# Patient Record
Sex: Male | Born: 1990 | Race: Black or African American | Hispanic: No | Marital: Single | Smoking: Never smoker
Health system: Southern US, Community
[De-identification: ages and names within clinical notes are randomized; demographics above are authoritative.]

## PROBLEM LIST (undated history)

## (undated) DIAGNOSIS — J45909 Unspecified asthma, uncomplicated: Secondary | ICD-10-CM

## (undated) DIAGNOSIS — I10 Essential (primary) hypertension: Secondary | ICD-10-CM

## (undated) DIAGNOSIS — F319 Bipolar disorder, unspecified: Secondary | ICD-10-CM

## (undated) DIAGNOSIS — F329 Major depressive disorder, single episode, unspecified: Secondary | ICD-10-CM

## (undated) DIAGNOSIS — E119 Type 2 diabetes mellitus without complications: Secondary | ICD-10-CM

## (undated) DIAGNOSIS — R42 Dizziness and giddiness: Secondary | ICD-10-CM

## (undated) DIAGNOSIS — F419 Anxiety disorder, unspecified: Secondary | ICD-10-CM

## (undated) DIAGNOSIS — F32A Depression, unspecified: Secondary | ICD-10-CM

## (undated) HISTORY — DX: Unspecified asthma, uncomplicated: J45.909

## (undated) HISTORY — DX: Essential (primary) hypertension: I10

## (undated) HISTORY — DX: Type 2 diabetes mellitus without complications: E11.9

## (undated) HISTORY — PX: WISDOM TOOTH EXTRACTION: SHX21

---
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
## 2011-03-24 LAB — URINALYSIS, ROUTINE W REFLEX MICROSCOPIC
Glucose, UA: NEGATIVE mg/dL
Protein, ur: NEGATIVE mg/dL

## 2011-03-24 LAB — POCT I-STAT, CHEM 8
BUN: 18 mg/dL (ref 6–23)
HCT: 45 % (ref 36.0–49.0)
Hemoglobin: 15.3 g/dL (ref 12.0–16.0)
Sodium: 139 mEq/L (ref 135–145)
TCO2: 27 mmol/L (ref 0–100)

## 2011-03-24 LAB — RAPID URINE DRUG SCREEN, HOSP PERFORMED: Benzodiazepines: NOT DETECTED

## 2011-05-11 ENCOUNTER — Emergency Department (HOSPITAL_COMMUNITY)
Admission: EM | Admit: 2011-05-11 | Discharge: 2011-05-12 | Disposition: A | Discharge status: Home / Self Care | Payer: Self-pay | Attending: Emergency Medicine | Admitting: Emergency Medicine

## 2011-05-11 DIAGNOSIS — R000 Tachycardia, unspecified: Secondary | ICD-10-CM | POA: Insufficient documentation

## 2011-05-11 DIAGNOSIS — R42 Dizziness and giddiness: Secondary | ICD-10-CM | POA: Insufficient documentation

## 2011-05-11 DIAGNOSIS — R079 Chest pain, unspecified: Secondary | ICD-10-CM | POA: Insufficient documentation

## 2011-05-11 DIAGNOSIS — R51 Headache: Secondary | ICD-10-CM | POA: Insufficient documentation

## 2011-05-11 DIAGNOSIS — F410 Panic disorder [episodic paroxysmal anxiety] without agoraphobia: Secondary | ICD-10-CM | POA: Insufficient documentation

## 2011-12-06 ENCOUNTER — Encounter: Payer: Self-pay | Admitting: *Deleted

## 2011-12-06 ENCOUNTER — Other Ambulatory Visit: Payer: Self-pay

## 2011-12-06 ENCOUNTER — Emergency Department (HOSPITAL_COMMUNITY)
Admission: EM | Admit: 2011-12-06 | Discharge: 2011-12-06 | Disposition: A | Discharge status: Home / Self Care | Payer: Self-pay | Attending: Emergency Medicine | Admitting: Emergency Medicine

## 2011-12-06 DIAGNOSIS — R42 Dizziness and giddiness: Secondary | ICD-10-CM | POA: Insufficient documentation

## 2011-12-06 DIAGNOSIS — R110 Nausea: Secondary | ICD-10-CM | POA: Insufficient documentation

## 2011-12-06 MED ORDER — DIAZEPAM 5 MG PO TABS
5.0000 mg | ORAL_TABLET | Freq: Once | ORAL | Status: AC
  Administered 2011-12-06: 5 mg via ORAL
  Filled 2011-12-06: qty 1

## 2011-12-06 MED ORDER — MECLIZINE HCL 25 MG PO TABS
25.0000 mg | ORAL_TABLET | Freq: Once | ORAL | Status: AC
  Administered 2011-12-06: 25 mg via ORAL
  Filled 2011-12-06: qty 1

## 2011-12-06 MED ORDER — MECLIZINE HCL 50 MG PO TABS: 25.0000 mg | ORAL_TABLET | Freq: Four times a day (QID) | ORAL | Status: AC | PRN

## 2011-12-06 MED ORDER — PROMETHAZINE HCL 25 MG PO TABS
25.0000 mg | ORAL_TABLET | Freq: Once | ORAL | Status: AC
  Administered 2011-12-06: 25 mg via ORAL
  Filled 2011-12-06: qty 1

## 2011-12-06 NOTE — ED Notes (Signed)
Pt laughing but refusing to stand up to get on the scale, when mother not @ side, pt stood up without difficulty & got on scale.
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
## 2011-12-06 NOTE — ED Notes (Signed)
Pt states "this has been going on a long time, was dx'd with anxiety @ [facility], don't take any medicine, I was dizzy, nauseated, my chest hurt"; mother now stating "he may need to be checked out, I'm bi-polar"; pt NAD
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
## 2015-04-05 ENCOUNTER — Encounter (HOSPITAL_COMMUNITY): Payer: Self-pay | Admitting: Emergency Medicine

## 2015-04-05 ENCOUNTER — Emergency Department (HOSPITAL_COMMUNITY)
Admission: EM | Admit: 2015-04-05 | Discharge: 2015-04-06 | Disposition: A | Discharge status: Other Acute Inpatient Hospital | Payer: Self-pay | Attending: Emergency Medicine | Admitting: Emergency Medicine

## 2015-04-05 DIAGNOSIS — F410 Panic disorder [episodic paroxysmal anxiety] without agoraphobia: Secondary | ICD-10-CM

## 2015-04-05 DIAGNOSIS — F411 Generalized anxiety disorder: Secondary | ICD-10-CM

## 2015-04-05 DIAGNOSIS — F329 Major depressive disorder, single episode, unspecified: Secondary | ICD-10-CM | POA: Insufficient documentation

## 2015-04-05 HISTORY — DX: Major depressive disorder, single episode, unspecified: F32.9

## 2015-04-05 HISTORY — DX: Anxiety disorder, unspecified: F41.9

## 2015-04-05 HISTORY — DX: Bipolar disorder, unspecified: F31.9

## 2015-04-05 HISTORY — DX: Depression, unspecified: F32.A

## 2015-04-05 HISTORY — DX: Dizziness and giddiness: R42

## 2015-04-05 LAB — CBC WITH DIFFERENTIAL/PLATELET
Basophils Absolute: 0 10*3/uL (ref 0.0–0.1)
Basophils Relative: 0 % (ref 0–1)
Eosinophils Absolute: 0.1 10*3/uL (ref 0.0–0.7)
Eosinophils Relative: 1 % (ref 0–5)
HCT: 43.2 % (ref 39.0–52.0)
Hemoglobin: 14.1 g/dL (ref 13.0–17.0)
Lymphocytes Relative: 23 % (ref 12–46)
Lymphs Abs: 1.5 10*3/uL (ref 0.7–4.0)
MCH: 28.9 pg (ref 26.0–34.0)
MCHC: 32.6 g/dL (ref 30.0–36.0)
MCV: 88.5 fL (ref 78.0–100.0)
Monocytes Absolute: 0.3 10*3/uL (ref 0.1–1.0)
Monocytes Relative: 5 % (ref 3–12)
Neutro Abs: 4.5 10*3/uL (ref 1.7–7.7)
Neutrophils Relative %: 71 % (ref 43–77)
Platelets: 305 10*3/uL (ref 150–400)
RBC: 4.88 MIL/uL (ref 4.22–5.81)
RDW: 13.1 % (ref 11.5–15.5)
WBC: 6.4 10*3/uL (ref 4.0–10.5)

## 2015-04-05 LAB — BASIC METABOLIC PANEL
Anion gap: 7 (ref 5–15)
BUN: 10 mg/dL (ref 6–23)
CO2: 25 mmol/L (ref 19–32)
Calcium: 9.1 mg/dL (ref 8.4–10.5)
Chloride: 105 mmol/L (ref 96–112)
Creatinine, Ser: 0.94 mg/dL (ref 0.50–1.35)
GFR calc Af Amer: >90 mL/min (ref >90)
GFR calc non Af Amer: >90 mL/min (ref >90)
Glucose, Bld: 101 mg/dL — ABNORMAL HIGH (ref 70–99)
Potassium: 3.8 mmol/L (ref 3.5–5.1)
Sodium: 137 mmol/L (ref 135–145)

## 2015-04-05 LAB — ETHANOL: Alcohol, Ethyl (B): <5 mg/dL (ref 0–9)

## 2015-04-05 MED ORDER — ACETAMINOPHEN 325 MG PO TABS
650.0000 mg | ORAL_TABLET | ORAL | Status: DC | PRN
  Filled 2015-04-05: qty 2

## 2015-04-05 MED ORDER — ONDANSETRON HCL 4 MG PO TABS: 4.0000 mg | ORAL_TABLET | Freq: Three times a day (TID) | ORAL | Status: DC | PRN

## 2015-04-05 MED ORDER — ALUM & MAG HYDROXIDE-SIMETH 200-200-20 MG/5ML PO SUSP: 30.0000 mL | ORAL | Status: DC | PRN

## 2015-04-05 MED ORDER — IBUPROFEN 200 MG PO TABS: 600.0000 mg | ORAL_TABLET | Freq: Three times a day (TID) | ORAL | Status: DC | PRN

## 2015-04-05 MED ORDER — LORAZEPAM 1 MG PO TABS: 1.0000 mg | ORAL_TABLET | Freq: Three times a day (TID) | ORAL | Status: DC | PRN

## 2015-04-05 NOTE — ED Notes (Signed)
Pt. To [facility] from ED ambulatory without difficulty, to room  . Report from [place]. Pt. Is alert and oriented, warm and dry in no distress. Pt. Denies SI, HI, and AVH. Pt. Calm and cooperative. Pt. Made aware of security cameras and Q15 minute rounds. Pt. Encouraged to let Nursing staff know of any concerns or needs.

## 2015-04-05 NOTE — ED Notes (Addendum)
Pt reports he has not been able to afford psych medications c/o anxiety attacks and SI. Pt denies HI. Pt reports he does not have a plan for SI and states feeling comes a goes. Pt also c/o dizziness and headache due to anxiety attack.

## 2015-04-05 NOTE — ED Notes (Signed)
Pt. Noted sleeping in room. No complaints or concerns voiced. No distress or abnormal behavior noted. Will continue to monitor with security cameras. Q 15 minute rounds continue. 
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
## 2015-04-06 ENCOUNTER — Encounter (HOSPITAL_COMMUNITY): Payer: Self-pay | Admitting: *Deleted

## 2015-04-06 ENCOUNTER — Observation Stay (HOSPITAL_COMMUNITY)
Admission: AD | Admit: 2015-04-06 | Discharge: 2015-04-07 | Disposition: A | Discharge status: Home / Self Care | Payer: Federal, State, Local not specified - Other | Source: Intra-hospital | Attending: Psychiatry | Admitting: Psychiatry

## 2015-04-06 DIAGNOSIS — R51 Headache: Secondary | ICD-10-CM | POA: Insufficient documentation

## 2015-04-06 DIAGNOSIS — F411 Generalized anxiety disorder: Principal | ICD-10-CM | POA: Diagnosis present

## 2015-04-06 DIAGNOSIS — F410 Panic disorder [episodic paroxysmal anxiety] without agoraphobia: Secondary | ICD-10-CM | POA: Insufficient documentation

## 2015-04-06 DIAGNOSIS — R45851 Suicidal ideations: Secondary | ICD-10-CM | POA: Insufficient documentation

## 2015-04-06 DIAGNOSIS — R42 Dizziness and giddiness: Secondary | ICD-10-CM | POA: Insufficient documentation

## 2015-04-06 DIAGNOSIS — F319 Bipolar disorder, unspecified: Secondary | ICD-10-CM | POA: Insufficient documentation

## 2015-04-06 DIAGNOSIS — R110 Nausea: Secondary | ICD-10-CM | POA: Insufficient documentation

## 2015-04-06 MED ORDER — LORAZEPAM 1 MG PO TABS
1.0000 mg | ORAL_TABLET | Freq: Four times a day (QID) | ORAL | Status: DC | PRN
  Filled 2015-04-06: qty 1

## 2015-04-06 MED ORDER — MECLIZINE HCL 25 MG PO TABS
25.0000 mg | ORAL_TABLET | Freq: Three times a day (TID) | ORAL | Status: DC | PRN
  Administered 2015-04-06: 25 mg via ORAL
  Filled 2015-04-06: qty 1

## 2015-04-06 MED ORDER — ZOLPIDEM TARTRATE 5 MG PO TABS: 5.0000 mg | ORAL_TABLET | Freq: Every evening | ORAL | Status: DC | PRN

## 2015-04-06 MED ORDER — IBUPROFEN 600 MG PO TABS
600.0000 mg | ORAL_TABLET | Freq: Four times a day (QID) | ORAL | Status: DC | PRN
  Administered 2015-04-06 – 2015-04-07 (×2): 600 mg via ORAL
  Filled 2015-04-06 (×2): qty 1

## 2015-04-06 MED ORDER — ONDANSETRON 4 MG PO TBDP: 4.0000 mg | ORAL_TABLET | Freq: Once | ORAL | Status: AC | PRN

## 2015-04-06 MED ORDER — MECLIZINE HCL 25 MG PO TABS
25.0000 mg | ORAL_TABLET | Freq: Three times a day (TID) | ORAL | Status: DC | PRN
  Filled 2015-04-06: qty 1

## 2015-04-06 NOTE — ED Notes (Signed)
Up to the bathroom 

## 2015-04-06 NOTE — ED Notes (Addendum)
Chest pain has resolved, Pt reports that he does not have a HA, but has intermittant pain in his head, worse w/ light and eye movement.  Pt also reports that the dizzyness is worse w/ head/eye movement.  NP aware. Pt declined tylenol because it is is not a HA.

## 2015-04-06 NOTE — ED Provider Notes (Signed)
CSN: [id number]     Arrival date & time 04/05/15  1947 History   First MD Initiated Contact with Patient 04/05/15 2018     Chief Complaint  Patient presents with  . Anxiety  . Suicidal     (Consider location/radiation/quality/duration/timing/severity/associated sxs/prior Treatment) HPI Patient presents to the emergency department with suicidal ideation and frequent anxiety attacks.  Patient states he is unable to get his medications due to cost.  The patient states that nothing seems make his condition, better or worse.  The patient states that he is not having any hallucinations, nausea, vomiting, weakness, dizziness, back pain, neck pain, shortness of breath, cough, runny nose, sore throat, fever or syncope.  Patient states that he has had some headache and chest discomfort since all this started he states the symptoms have been getting worse over the last week Past Medical History  Diagnosis Date  . Anxiety   . Bipolar 1 disorder   . Depression   . Vertigo    History reviewed. No pertinent past surgical history. History reviewed. No pertinent family history. History  Substance Use Topics  . Smoking status: Never Smoker   . Smokeless tobacco: Not on file  . Alcohol Use: No    Review of Systems  All other systems negative except as documented in the HPI. All pertinent positives and negatives as reviewed in the HPI.  Allergies  Review of patient's allergies indicates no known allergies.  Home Medications   Prior to Admission medications   Medication Sig Start Date End Date Taking? Authorizing Provider  Acetaminophen (PAIN RELIEF PO) Take 1 tablet by mouth daily as needed (pain).   Yes Historical Provider, MD   BP 141/92 mmHg  Pulse 78  Temp(Src) 98.5 F (36.9 C) (Oral)  Resp 22  SpO2 100% Physical Exam  Constitutional: He is oriented to person, place, and time. He appears well-developed and well-nourished. No distress.  HENT:  Head: Normocephalic and atraumatic.   Mouth/Throat: Oropharynx is clear and moist.  Eyes: Pupils are equal, round, and reactive to light.  Neck: Normal range of motion. Neck supple.  Cardiovascular: Normal rate, regular rhythm and normal heart sounds.  Exam reveals no gallop and no friction rub.   No murmur heard. Pulmonary/Chest: Effort normal and breath sounds normal. No respiratory distress.  Neurological: He is alert and oriented to person, place, and time. He exhibits normal muscle tone. Coordination normal.  Skin: Skin is warm and dry.  Psychiatric: Judgment normal. His mood appears anxious. He is not agitated. Thought content is not paranoid and not delusional. Cognition and memory are normal. He exhibits a depressed mood. He expresses suicidal ideation. He expresses no homicidal ideation. He expresses no suicidal plans and no homicidal plans.    ED Course  Procedures (including critical care time) Labs Review Labs Reviewed  BASIC METABOLIC PANEL - Abnormal; Notable for the following:    Glucose, Bld 101 (*)    All other components within normal limits  CBC WITH DIFFERENTIAL/PLATELET  ETHANOL  URINE RAPID DRUG SCREEN (HOSP PERFORMED)   The patient will need TTS assessment     Dalia Heading, PA-C 04/06/15 0120  Lacretia Leigh, MD 04/06/15 1537

## 2015-04-06 NOTE — Progress Notes (Signed)
D: Patient seen watching TV and interacting with peer. Patient stated "I am fine now except for mild dizziness". Patient denies pain, SI, AH/VH at this time. Patient is pleasant and appropriate.  A: Encouragement offered to the patient. Due medications given as ordered. Every 15 minutes check maintained for safety. Will continue to monitor for stability.  R: patient very receptive to nursing interventions.

## 2015-04-06 NOTE — ED Notes (Signed)
Sandwich and ice pop given

## 2015-04-06 NOTE — ED Notes (Signed)
Pt. Noted sleeping in room. No complaints or concerns voiced. No distress or abnormal behavior noted. Will continue to monitor with security cameras. Q 15 minute rounds continue. 
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
## 2015-04-06 NOTE — ED Notes (Addendum)
Pt ambulatory w/o difficulty to [facility] w/ [facility]

## 2015-04-06 NOTE — ED Notes (Signed)
[facility] contacted and will be here in approx 20 mins

## 2015-04-06 NOTE — ED Notes (Signed)
Up on the phone 

## 2015-04-06 NOTE — ED Notes (Signed)
EDP updated-recommend tylenol/motrin

## 2015-04-06 NOTE — ED Notes (Signed)
Pt reports antervirt did not help dizzyness and does not want to take tylenol

## 2015-04-06 NOTE — ED Notes (Signed)
Pt now reports that the head pain has resolved, and dizzyness has improved.  Pt has been ambulatory in the unit during the day w/o difficulty.
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
## 2015-04-06 NOTE — Progress Notes (Signed)
[facility] Assessment Progress Note Counselor spoke to pt about discharge planning and his reasons for presenting to the OBS unit. Pt currently denies SI, HI, or AVH. He indicated that he is just dizzy. Pt shares that he has had panic attacks all the time for about 5 years, but he's been maintaining because he "won't feed into them" or with "take an aspirin and go to sleep". Pt shares that the panic attack that brought him to the ED was the "most intense one" he has had and he wasn't able to ignore it.   Pt was not able to identify one single trigger with regard to his continuous panic attacks. He speculated that his diet and lack of exercise might be a cause and indicated that he planned on changing his diet and increasing his exercise upon discharge. Pt also voluntarily identified singing and running as outlets for him to relieve anxiety. Pt denies any hx of or current substance abuse issues.   Pt shared that he lived "on and off" with his sister. He indicated that all of his things are there, but sometimes he's not able to stay b/c she has Section 8 and is not supposed to have other people not on the lease living with her. He shared that when he is not at his sister's house, he will sometimes stay in a shelter. He indicated that he has a couple of jobs lined up and just needs to fill out the applications online. He says that shelter is not an immediate issue for him.   Pt shared that he really just needs help with getting his medicines and obtaining counseling. Counselor discussed [facility] with pt and he indicated that he had been there before and would like to go there again for counseling and psychiatric care.   Marisa Cyphers, MS Counselor

## 2015-04-06 NOTE — ED Notes (Signed)
Up to the desk, pt is aware that he is goingg to be transferred to [facility] obs unit today.
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
## 2015-04-07 NOTE — BHH Counselor (Addendum)
Writer called and Patent attorney for Melissa at [facility]. Melissa called back and said that they don't have any beds. Writer called [facility] and [facility] reports that pt doesn't meet SA treatment criteria.  Arnold Long, [place] Therapeutic Triage Specialist

## 2015-04-07 NOTE — Discharge Summary (Signed)
Physician Discharge Summary Note  Patient:  Nathan Bradford is an 24 y.o., male with SI and panic related to dizzy spells hx vertigo.  MRN:  [id number] DOB:  [birth year] Patient phone:  [phone] (home)  Patient address:   [address] [place] [address],  Total Time spent with patient: 30 minutes  Date of Admission:  04/06/2015 Date of Discharge: 04/07/2015  HPI: copied forward Patient presents to the emergency department with suicidal ideation and frequent anxiety attacks. Patient states he is unable to get his medications due to cost. The patient states that nothing seems make his condition, better or worse. The patient states that he is not having any hallucinations, nausea, vomiting, weakness, dizziness, back pain, neck pain, shortness of breath, cough, runny nose, sore throat, fever or syncope. Patient states that he has had some headache and chest discomfort since all this started he states the symptoms have been getting worse over the last week  04/07/2015: patient explains to writer that has a history of bipolar disorder, anxiety, and vertigo. Explains that because his vertigo can sometimes be severe this causes increasing anxiety and panic. When asked if he has SI he states, "I always have suicidal thoughts but I will not hurt myself". Denies HI or AVH. Plans to report to [facility] for medication management upon discharge from [facility] as they do not close until 8pm. Agrees to sign safety contract.    Principal Problem: <principal problem not specified> Discharge Diagnoses: Patient Active Problem List   Diagnosis Date Noted  . Generalized anxiety disorder [F41.1] 04/06/2015  . Panic disorder [F41.0] 04/06/2015    Musculoskeletal: Strength & Muscle Tone: within normal limits Gait & Station: normal Patient leans: N/A  Psychiatric Specialty Exam: Physical Exam  ROS  Blood pressure 120/76, pulse 64, temperature 98.3 F (36.8 C), temperature  source Oral, resp. rate 20, height '6\' 1"'  (1.854 m), weight 116.574 kg (257 lb), SpO2 100 %.Body mass index is 33.91 kg/(m^2).  General Appearance: Fairly Groomed  Engineer, water::  Good  Speech:  Clear and Coherent and Normal Rate  Volume:  Normal  Mood:  Anxious  Affect:  Appropriate  Thought Process:  Coherent and Goal Directed  Orientation:  Full (Time, Place, and Person)  Thought Content:  WDL  Suicidal Thoughts:  Yes.  without intent/plan  Homicidal Thoughts:  No  Memory:  Immediate;   Good Recent;   Good  Judgement:  Good  Insight:  Good  Psychomotor Activity:  Normal  Concentration:  Good  Recall:  Good  Fund of Knowledge:Good  Language: Good  Akathisia:  No  Handed:    AIMS (if indicated):     Assets:  Communication Skills Desire for Improvement  ADL's:  Intact  Cognition: WNL  Sleep:      Past Medical History:  Past Medical History  Diagnosis Date  . Anxiety   . Bipolar 1 disorder   . Depression   . Vertigo    History reviewed. No pertinent past surgical history. Family History: History reviewed. No pertinent family history. Social History:  History  Alcohol Use No     History  Drug Use No    History   Social History  . Marital Status: Single    Spouse Name: N/A  . Number of Children: N/A  . Years of Education: N/A   Social History Main Topics  . Smoking status: Never Smoker   . Smokeless tobacco: Not on file  . Alcohol Use: No  .  Drug Use: No  . Sexual Activity: Not on file   Other Topics Concern  . None   Social History Narrative    Past Psychiatric History: Hospitalizations:  Outpatient Care:  Substance Abuse Care:  Self-Mutilation:  Suicidal Attempts:  Violent Behaviors:   Risk to Self: Is patient at risk for suicide?: No- SI without a plan; contracts for safety  Risk to Others:  none Prior Inpatient Therapy:   Prior Outpatient Therapy:    Level of Care:  OP  Hospital Course:  04/05/2015-04/07/2015  Consults:   None  Significant Diagnostic Studies:  labs: reviewed- stable   Discharge Vitals:   Blood pressure 120/76, pulse 64, temperature 98.3 F (36.8 C), temperature source Oral, resp. rate 20, height '6\' 1"'  (1.854 m), weight 116.574 kg (257 lb), SpO2 100 %. Body mass index is 33.91 kg/(m^2). Lab Results:   Results for orders placed or performed during the hospital encounter of 04/05/15 (from the past 72 hour(s))  CBC with Differential     Status: None   Collection Time: 04/05/15 10:50 PM  Result Value Ref Range   WBC 6.4 4.0 - 10.5 K/uL   RBC 4.88 4.22 - 5.81 MIL/uL   Hemoglobin 14.1 13.0 - 17.0 g/dL   HCT 43.2 39.0 - 52.0 %   MCV 88.5 78.0 - 100.0 fL   MCH 28.9 26.0 - 34.0 pg   MCHC 32.6 30.0 - 36.0 g/dL   RDW 13.1 11.5 - 15.5 %   Platelets 305 150 - 400 K/uL   Neutrophils Relative % 71 43 - 77 %   Neutro Abs 4.5 1.7 - 7.7 K/uL   Lymphocytes Relative 23 12 - 46 %   Lymphs Abs 1.5 0.7 - 4.0 K/uL   Monocytes Relative 5 3 - 12 %   Monocytes Absolute 0.3 0.1 - 1.0 K/uL   Eosinophils Relative 1 0 - 5 %   Eosinophils Absolute 0.1 0.0 - 0.7 K/uL   Basophils Relative 0 0 - 1 %   Basophils Absolute 0.0 0.0 - 0.1 K/uL  Basic metabolic panel     Status: Abnormal   Collection Time: 04/05/15 10:50 PM  Result Value Ref Range   Sodium 137 135 - 145 mmol/L   Potassium 3.8 3.5 - 5.1 mmol/L   Chloride 105 96 - 112 mmol/L   CO2 25 19 - 32 mmol/L   Glucose, Bld 101 (H) 70 - 99 mg/dL   BUN 10 6 - 23 mg/dL   Creatinine, Ser 0.94 0.50 - 1.35 mg/dL   Calcium 9.1 8.4 - 10.5 mg/dL   GFR calc non Af Amer >90 >90 mL/min   GFR calc Af Amer >90 >90 mL/min    Comment: (NOTE) The eGFR has been calculated using the CKD EPI equation. This calculation has not been validated in all clinical situations. eGFR's persistently <90 mL/min signify possible Chronic Kidney Disease.    Anion gap 7 5 - 15  Ethanol     Status: None   Collection Time: 04/05/15 10:51 PM  Result Value Ref Range   Alcohol, Ethyl (B) <5  0 - 9 mg/dL    Comment:        LOWEST DETECTABLE LIMIT FOR SERUM ALCOHOL IS 11 mg/dL FOR MEDICAL PURPOSES ONLY     Physical Findings: AIMS: Facial and Oral Movements Muscles of Facial Expression: None, normal Lips and Perioral Area: None, normal Jaw: None, normal Tongue: None, normal,Extremity Movements Upper (arms, wrists, hands, fingers): None, normal Lower (legs, knees, ankles, toes): None,  normal, Trunk Movements Neck, shoulders, hips: None, normal, Overall Severity Severity of abnormal movements (highest score from questions above): None, normal Incapacitation due to abnormal movements: None, normal Patient's awareness of abnormal movements (rate only patient's report): No Awareness, Dental Status Current problems with teeth and/or dentures?: No Does patient usually wear dentures?: No  CIWA:    COWS:      See Psychiatric Specialty Exam and Suicide Risk Assessment completed by Attending Physician prior to discharge.  Discharge destination:  Home  Is patient on multiple antipsychotic therapies at discharge:  No   Has Patient had three or more failed trials of antipsychotic monotherapy by history:  No    Recommended Plan for Multiple Antipsychotic Therapies: NA     Medication List    ASK your doctor about these medications      Indication   PAIN RELIEF PO  Take 1 tablet by mouth daily as needed (pain).          Follow-up recommendations:  Activity:  as tolerated Diet:  regular  Comments:  24 year old male admitted with SI and panic related to dizziness- hx vertigo. States that he has had SI today but contracts for safety-- Surveyor, mining signed and discussed with patient. Materials given to patient re: suicide hotline and mobile contact information. Patient verbalized understanding of all information/instructions.     Total Discharge Time: 30 minutes  Signed: Gypsy Lore CORI 04/07/2015, 3:04 PM

## 2015-04-07 NOTE — BHH Counselor (Signed)
Writer discussed with pt various outpatient options for treatment. Probation officer provided pt with Psychologist, counselling for [facility] and [facility]. Pt denies SI and HI. He denies [facility]. Pt appears euthymic and is joking and laughing appropriately with OBS unit staff.  Arnold Long, [place] Therapeutic Triage Specialist

## 2015-04-07 NOTE — BHH Counselor (Signed)
Per Glenda Chroman NP's request, writer faxed referrals to [facility] and [facility].  Arnold Long, [place] Therapeutic Triage Specialist

## 2015-05-22 NOTE — H&P (Deleted)
Patient: Nathan Bradford is an 24 y.o., male with SI and panic related to dizzy spells hx vertigo.  MRN: [id number] DOB: 24-Jan-1991 Patient phone: [phone] (home) Patient address:  [place] [address]  HPI: copied forward Patient presents to the emergency department with suicidal ideation and frequent anxiety attacks. Patient states he is unable to get his medications due to cost. The patient states that nothing seems make his condition, better or worse. The patient states that he is not having any hallucinations, nausea, vomiting, weakness, dizziness, back pain, neck pain, shortness of breath, cough, runny nose, sore throat, fever or syncope. Patient states that he has had some headache and chest discomfort since all this started he states the symptoms have been getting worse over the last week  04/07/2015: patient explains to writer that has a history of bipolar disorder, anxiety, and vertigo. Explains that because his vertigo can sometimes be severe this causes increasing anxiety and panic. When asked if he has SI he states, "I always have suicidal thoughts but I will not hurt myself". Denies HI or AVH. Plans to report to [facility] for medication management upon discharge from [facility] as they do not close until 8pm. Agrees to sign safety contract.   Principal Problem: <principal problem not specified> Discharge Diagnoses: Patient Active Problem List   Diagnosis Date Noted  . Generalized anxiety disorder [F41.1] 04/06/2015  . Panic disorder [F41.0] 04/06/2015    Musculoskeletal: Strength & Muscle Tone: within normal limits Gait & Station: normal Patient leans: N/A  Psychiatric Specialty Exam: Physical Exam  ROS  Blood pressure 120/76, pulse 64, temperature 98.3 F (36.8 C), temperature source Oral, resp. rate 20, height 6' 1" (1.854 m), weight 116.574 kg (257 lb), SpO2 100 %.Body mass index is 33.91  kg/(m^2).  General Appearance: Fairly Groomed  Engineer, water:: Good  Speech: Clear and Coherent and Normal Rate  Volume: Normal  Mood: Anxious  Affect: Appropriate  Thought Process: Coherent and Goal Directed  Orientation: Full (Time, Place, and Person)  Thought Content: WDL  Suicidal Thoughts: Yes. without intent/plan  Homicidal Thoughts: No  Memory: Immediate; Good Recent; Good  Judgement: Good  Insight: Good  Psychomotor Activity: Normal  Concentration: Good  Recall: Good  Fund of Knowledge:Good  Language: Good  Akathisia: No  Handed:   AIMS (if indicated):    Assets: Communication Skills Desire for Improvement  ADL's: Intact  Cognition: WNL  Sleep:     Past Medical History:  Past Medical History  Diagnosis Date  . Anxiety   . Bipolar 1 disorder   . Depression   . Vertigo    History reviewed. No pertinent past surgical history. Family History: History reviewed. No pertinent family history. Social History:  History  Alcohol Use No    History  Drug Use No    History   Social History  . Marital Status: Single    Spouse Name: N/A  . Number of Children: N/A  . Years of Education: N/A   Social History Main Topics  . Smoking status: Never Smoker   . Smokeless tobacco: Not on file  . Alcohol Use: No  . Drug Use: No  . Sexual Activity: Not on file   Other Topics Concern  . None   Social History Narrative   Lab Results:   Lab Results Past 72 Hours    Results for orders placed or performed during the hospital encounter of 04/05/15 (from the past 72 hour(s))  CBC with  Differential Status: None   Collection Time: 04/05/15 10:50 PM  Result Value Ref Range   WBC 6.4 4.0 - 10.5 K/uL   RBC 4.88 4.22 - 5.81 MIL/uL   Hemoglobin 14.1 13.0 - 17.0 g/dL   HCT 43.2 39.0 - 52.0 %   MCV 88.5 78.0 - 100.0 fL    MCH 28.9 26.0 - 34.0 pg   MCHC 32.6 30.0 - 36.0 g/dL   RDW 13.1 11.5 - 15.5 %   Platelets 305 150 - 400 K/uL   Neutrophils Relative % 71 43 - 77 %   Neutro Abs 4.5 1.7 - 7.7 K/uL   Lymphocytes Relative 23 12 - 46 %   Lymphs Abs 1.5 0.7 - 4.0 K/uL   Monocytes Relative 5 3 - 12 %   Monocytes Absolute 0.3 0.1 - 1.0 K/uL   Eosinophils Relative 1 0 - 5 %   Eosinophils Absolute 0.1 0.0 - 0.7 K/uL   Basophils Relative 0 0 - 1 %   Basophils Absolute 0.0 0.0 - 0.1 K/uL  Basic metabolic panel Status: Abnormal   Collection Time: 04/05/15 10:50 PM  Result Value Ref Range   Sodium 137 135 - 145 mmol/L   Potassium 3.8 3.5 - 5.1 mmol/L   Chloride 105 96 - 112 mmol/L   CO2 25 19 - 32 mmol/L   Glucose, Bld 101 (H) 70 - 99 mg/dL   BUN 10 6 - 23 mg/dL   Creatinine, Ser 0.94 0.50 - 1.35 mg/dL   Calcium 9.1 8.4 - 10.5 mg/dL   GFR calc non Af Amer >90 >90 mL/min   GFR calc Af Amer >90 >90 mL/min    Comment: (NOTE) The eGFR has been calculated using the CKD EPI equation. This calculation has not been validated in all clinical situations. eGFR's persistently <90 mL/min signify possible Chronic Kidney Disease.    Anion gap 7 5 - 15  Ethanol Status: None   Collection Time: 04/05/15 10:51 PM  Result Value Ref Range   Alcohol, Ethyl (B) <5 0 - 9 mg/dL    Comment:   LOWEST DETECTABLE LIMIT FOR SERUM ALCOHOL IS 11 mg/dL FOR MEDICAL PURPOSES ONLY      Physical Findings: AIMS: Facial and Oral Movements Muscles of Facial Expression: None, normal Lips and Perioral Area: None, normal Jaw: None, normal Tongue: None, normal,Extremity Movements Upper (arms, wrists, hands, fingers): None, normal Lower (legs, knees, ankles, toes): None, normal, Trunk Movements Neck, shoulders, hips: None, normal, Overall Severity Severity of abnormal movements (highest score from  questions above): None, normal Incapacitation due to abnormal movements: None, normal Patient's awareness of abnormal movements (rate only patient's report): No Awareness, Dental Status Current problems with teeth and/or dentures?: No Does patient usually wear dentures?: No     Medication List    ASK your doctor about these medications     Indication   PAIN RELIEF PO  Take 1 tablet by mouth daily as needed (pain).        Diet: regular  Comments:24 year old male admitted with SI and panic related to dizziness- hx vertigo. States that he has had SI today but contracts for safety-- Surveyor, mining signed and discussed with patient. Materials given to patient re: suicide hotline and mobile contact information. Patient verbalized understanding of all information/instructions.        ROS- deferred see psychiatric evaluation above   Physical Exam- deferred see psychiatric evaluation above   Gypsy Lore CORI

## 2015-05-23 NOTE — H&P (Signed)
Patient:  Nathan Bradford is an 24 y.o., male with SI and panic related to dizzy spells hx vertigo.   MRN:  [id number] DOB:  June 05, 1991 Patient phone:  [phone] (home)          Patient address:    [address] [place] [address],           Total Time spent with patient: 30 minutes  Date of Admission:  04/06/2015 Date of Discharge: 04/07/2015  HPI: copied forward Patient presents to the emergency department with suicidal ideation and frequent anxiety attacks.  Patient states he is unable to get his medications due to cost.  The patient states that nothing seems make his condition, better or worse.  The patient states that he is not having any hallucinations, nausea, vomiting, weakness, dizziness, back pain, neck pain, shortness of breath, cough, runny nose, sore throat, fever or syncope.  Patient states that he has had some headache and chest discomfort since all this started he states the symptoms have been getting worse over the last week  04/07/2015: patient explains to writer that has a history of bipolar disorder, anxiety, and vertigo. Explains that because his vertigo can sometimes be severe this causes increasing anxiety and panic. When asked if he has SI he states, "I always have suicidal thoughts but I will not hurt myself". Denies HI or AVH. Plans to report to [place] for medication management upon discharge from [facility] as they do not close until 8pm. Agrees to sign safety contract.    Principal Problem: <principal problem not specified> Discharge Diagnoses: Patient Active Problem List     Diagnosis  Date Noted   .  Generalized anxiety disorder [F41.1]  04/06/2015   .  Panic disorder [F41.0]  04/06/2015     Psychiatric Specialty Exam: Physical Exam   ROS   Blood pressure 120/76, pulse 64, temperature 98.3 F (36.8 C), temperature source Oral, resp. rate 20, height '6\' 1"'  (1.854 m), weight 116.574 kg (257 lb), SpO2 100 %.Body mass index is 33.91  kg/(m^2).   General Appearance: Fairly Groomed   Engineer, water::  Good   Speech:  Clear and Coherent and Normal Rate   Volume:  Normal   Mood:  Anxious   Affect:  Appropriate   Thought Process:  Coherent and Goal Directed   Orientation:  Full (Time, Place, and Person)   Thought Content:  WDL   Suicidal Thoughts:  Yes.  without intent/plan   Homicidal Thoughts:  No   Memory:  Immediate;   Good Recent;   Good   Judgement:  Good   Insight:  Good   Psychomotor Activity:  Normal   Concentration:  Good   Recall:  Good   Fund of Knowledge:Good   Language: Good   Akathisia:  No   Handed:     AIMS (if indicated):      Assets:  Communication Skills Desire for Improvement   ADL's:  Intact   Cognition: WNL   Sleep:       Past Medical History:   Past Medical History   Diagnosis  Date   .  Anxiety     .  Bipolar 1 disorder     .  Depression     .  Vertigo      History reviewed. No pertinent past surgical history. Family History: History reviewed. No pertinent family history. Social History:   History   Alcohol Use  No  History   Drug Use  No      History      Social History   .  Marital Status:  Single       Spouse Name:  N/A   .  Number of Children:  N/A   .  Years of Education:  N/A      Social History Main Topics   .  Smoking status:  Never Smoker    .  Smokeless tobacco:  Not on file   .  Alcohol Use:  No   .  Drug Use:  No   .  Sexual Activity:  Not on file      Other Topics  Concern   .  None      Social History Narrative     Lab Results:     Lab Results Past 72 Hours    Results for orders placed or performed during the hospital encounter of 04/05/15 (from the past 72 hour(s))   CBC with Differential     Status: None     Collection Time: 04/05/15 10:50 PM   Result  Value  Ref Range     WBC  6.4  4.0 - 10.5 K/uL     RBC  4.88  4.22 - 5.81 MIL/uL     Hemoglobin  14.1  13.0 - 17.0 g/dL     HCT  43.2  39.0 - 52.0 %     MCV  88.5  78.0 - 100.0 fL      MCH  28.9  26.0 - 34.0 pg     MCHC  32.6  30.0 - 36.0 g/dL     RDW  13.1  11.5 - 15.5 %     Platelets  305  150 - 400 K/uL     Neutrophils Relative %  71  43 - 77 %     Neutro Abs  4.5  1.7 - 7.7 K/uL     Lymphocytes Relative  23  12 - 46 %     Lymphs Abs  1.5  0.7 - 4.0 K/uL     Monocytes Relative  5  3 - 12 %     Monocytes Absolute  0.3  0.1 - 1.0 K/uL     Eosinophils Relative  1  0 - 5 %     Eosinophils Absolute  0.1  0.0 - 0.7 K/uL     Basophils Relative  0  0 - 1 %     Basophils Absolute  0.0  0.0 - 0.1 K/uL   Basic metabolic panel     Status: Abnormal     Collection Time: 04/05/15 10:50 PM   Result  Value  Ref Range     Sodium  137  135 - 145 mmol/L     Potassium  3.8  3.5 - 5.1 mmol/L     Chloride  105  96 - 112 mmol/L     CO2  25  19 - 32 mmol/L     Glucose, Bld  101 (H)  70 - 99 mg/dL     BUN  10  6 - 23 mg/dL     Creatinine, Ser  0.94  0.50 - 1.35 mg/dL     Calcium  9.1  8.4 - 10.5 mg/dL     GFR calc non Af Amer  >90  >90 mL/min     GFR calc Af Amer  >90  >90 mL/min       Comment:  (NOTE) The  eGFR has been calculated using the CKD EPI equation. This calculation has not been validated in all clinical situations. eGFR's persistently <90 mL/min signify possible Chronic Kidney Disease.      Anion gap  7  5 - 15   Ethanol     Status: None     Collection Time: 04/05/15 10:51 PM   Result  Value  Ref Range     Alcohol, Ethyl (B)  <5  0 - 9 mg/dL       Comment:          LOWEST DETECTABLE LIMIT FOR SERUM ALCOHOL IS 11 mg/dL FOR MEDICAL PURPOSES ONLY          Medication List      ASK your doctor about these medications         Indication     PAIN RELIEF PO   Take 1 tablet by mouth daily as needed (pain).            Comments:  24 year old male admitted with SI and panic related to dizziness- hx vertigo. States that he has had SI today but contracts for safety-- Surveyor, mining signed and discussed with patient. Materials given to patient re: suicide hotline and  mobile contact information. Patient verbalized understanding of all information/instructions.         ROS-deferred see psychiatric evaluation above Physical Exam- deferred see psychiatric evaluation above   Gypsy Lore CORI

## 2015-09-19 ENCOUNTER — Encounter (HOSPITAL_COMMUNITY): Payer: Self-pay | Admitting: Emergency Medicine

## 2015-09-19 ENCOUNTER — Emergency Department (HOSPITAL_COMMUNITY)
Admission: EM | Admit: 2015-09-19 | Discharge: 2015-09-19 | Disposition: A | Discharge status: Home / Self Care | Payer: Self-pay | Attending: Emergency Medicine | Admitting: Emergency Medicine

## 2015-09-19 DIAGNOSIS — I10 Essential (primary) hypertension: Secondary | ICD-10-CM | POA: Insufficient documentation

## 2015-09-19 DIAGNOSIS — Z8659 Personal history of other mental and behavioral disorders: Secondary | ICD-10-CM | POA: Insufficient documentation

## 2015-09-19 DIAGNOSIS — R42 Dizziness and giddiness: Secondary | ICD-10-CM | POA: Insufficient documentation

## 2015-09-19 LAB — BASIC METABOLIC PANEL
Anion gap: 6 (ref 5–15)
BUN: 14 mg/dL (ref 6–20)
CHLORIDE: 105 mmol/L (ref 101–111)
CO2: 27 mmol/L (ref 22–32)
Calcium: 9.2 mg/dL (ref 8.9–10.3)
Creatinine, Ser: 1.13 mg/dL (ref 0.61–1.24)
GFR calc Af Amer: >60 mL/min (ref >60)
GLUCOSE: 91 mg/dL (ref 65–99)
Potassium: 3.8 mmol/L (ref 3.5–5.1)
Sodium: 138 mmol/L (ref 135–145)

## 2015-09-19 LAB — CBC
HEMATOCRIT: 43.5 % (ref 39.0–52.0)
Hemoglobin: 14.5 g/dL (ref 13.0–17.0)
MCH: 29.3 pg (ref 26.0–34.0)
MCHC: 33.3 g/dL (ref 30.0–36.0)
MCV: 87.9 fL (ref 78.0–100.0)
Platelets: 343 10*3/uL (ref 150–400)
RBC: 4.95 MIL/uL (ref 4.22–5.81)
RDW: 12.9 % (ref 11.5–15.5)
WBC: 5 10*3/uL (ref 4.0–10.5)

## 2015-09-19 LAB — CBG MONITORING, ED: Glucose-Capillary: 129 mg/dL — ABNORMAL HIGH (ref 65–99)

## 2015-09-19 NOTE — ED Provider Notes (Signed)
CSN: [id number]     Arrival date & time 09/19/15  1647 History   First MD Initiated Contact with Patient 09/19/15 1941     Chief Complaint  Patient presents with  . Dizziness     (Consider location/radiation/quality/duration/timing/severity/associated sxs/prior Treatment) HPI Comments: 24 year old male with a history of bipolar disorder and vertigo who presents with dizziness. Patient states that he has a 13-14 year history of dizziness and has been diagnosed with vertigo in the past. He has been placed on multiple medications previously but states that none of them helped. Patient states that he has been getting dizzy while at work and was sent here by his job to get a paper signed so that he can work. He endorses occasional ringing in his ears. No headache or blurry vision. No extremity weakness or numbness. He thinks that the dizziness may get worse when he moves his head. No fevers or recent illness. He is also concerned about high blood pressure or diabetes.  Patient is a 24 y.o. male presenting with dizziness. The history is provided by the patient.  Dizziness   Past Medical History  Diagnosis Date  . Anxiety   . Bipolar 1 disorder ([place])   . Depression   . Vertigo    No past surgical history on file. No family history on file. Social History  Substance Use Topics  . Smoking status: Never Smoker   . Smokeless tobacco: None  . Alcohol Use: No    Review of Systems  Neurological: Positive for dizziness.    10 Systems reviewed and are negative for acute change except as noted in the HPI.   Allergies  Review of patient's allergies indicates no known allergies.  Home Medications   Prior to Admission medications   Not on File   BP 151/93 mmHg  Pulse 77  Temp(Src) 98.4 F (36.9 C) (Oral)  Resp 16  SpO2 100% Physical Exam  Constitutional: He is oriented to person, place, and time. He appears well-developed and well-nourished. No distress.  Awake, alert  HENT:   Head: Normocephalic and atraumatic.  Mouth/Throat: Oropharynx is clear and moist.  Eyes: Conjunctivae and EOM are normal. Pupils are equal, round, and reactive to light.  Neck: Neck supple.  Cardiovascular: Normal rate, regular rhythm and normal heart sounds.   No murmur heard. Pulmonary/Chest: Effort normal and breath sounds normal. No respiratory distress.  Abdominal: Soft. Bowel sounds are normal. He exhibits no distension.  Musculoskeletal: He exhibits no edema.  Neurological: He is alert and oriented to person, place, and time. He has normal reflexes. No cranial nerve deficit. He exhibits normal muscle tone.  Fluent speech, normal finger-to-nose testing, negative pronator drift  Skin: Skin is warm and dry.  Psychiatric: He has a normal mood and affect. Judgment and thought content normal.  Nursing note and vitals reviewed.   ED Course  Procedures (including critical care time) Labs Review Labs Reviewed  CBG MONITORING, ED - Abnormal; Notable for the following:    Glucose-Capillary 129 (*)    All other components within normal limits  BASIC METABOLIC PANEL  CBC  URINALYSIS, ROUTINE W REFLEX MICROSCOPIC (NOT AT [facility])    Imaging Review No results found. I have personally reviewed and evaluated these lab results as part of my medical decision-making.   EKG Interpretation   Date/Time:  Friday September 19 2015 19:47:10 EDT Ventricular Rate:  75 PR Interval:  188 QRS Duration: 99 QT Interval:  368 QTC Calculation: 411 R Axis:   66  Text Interpretation:  Sinus rhythm Borderline T wave abnormalities  Borderline ST elevation, anterior leads Baseline wander in lead(s) V2 V3  No significant change since last tracing Confirmed by LITTLE MD, RACHEL  [phone]) on 09/19/2015 8:37:16 PM      MDM   Final diagnoses:  Dizziness   24 year old male who presents with many years of dizziness that has been worse at work recently L as well as concerns for high blood pressure or diabetes.  Patient well-appearing at presentation. He was mildly hypertensive w/ sBP 150s. Orthostatic vital signs negative. Complete neurologic exam was normal. Obtained a BMP and CBC which showed no abnormalities to explain the patient's symptoms. No evidence of diabetes on BMP. EKG with no abnormalities compared to previous. Given the chronicity of the patient's symptoms, I feel that acute intracranial process is unlikely. I have emphasized the importance of follow-up with a PCP for blood pressure recheck, HgbA1C, further investigation of vertigo, and possible referral to ENT vs neurology. Pt voiced understanding. Visual acuity was 20/50 and I instructed him to have his eyes checked. Patient discharged in satisfactory condition.  Sharlett Iles, MD 09/19/15 2038

## 2015-09-19 NOTE — ED Notes (Signed)
Pt has been told a urine sample is needed from him, says that he can't go right now

## 2015-09-19 NOTE — Discharge Instructions (Signed)
Dizziness Dizziness is a common problem. It is a feeling of unsteadiness or light-headedness. You may feel like you are about to faint. Dizziness can lead to injury if you stumble or fall. Anyone can become dizzy, but dizziness is more common in older adults. This condition can be caused by a number of things, including medicines, dehydration, or illness. HOME CARE INSTRUCTIONS Taking these steps may help with your condition: Eating and Drinking  Drink enough fluid to keep your urine clear or pale yellow. This helps to keep you from becoming dehydrated. Try to drink more clear fluids, such as water.  Do not drink alcohol.  Limit your caffeine intake if directed by your health care provider.  Limit your salt intake if directed by your health care provider. Activity  Avoid making quick movements.  Rise slowly from chairs and steady yourself until you feel okay.  In the morning, first sit up on the side of the bed. When you feel okay, stand slowly while you hold onto something until you know that your balance is fine.  Move your legs often if you need to stand in one place for a long time. Tighten and relax your muscles in your legs while you are standing.  Do not drive or operate heavy machinery if you feel dizzy.  Avoid bending down if you feel dizzy. Place items in your home so that they are easy for you to reach without leaning over. Lifestyle  Do not use any tobacco products, including cigarettes, chewing tobacco, or electronic cigarettes. If you need help quitting, ask your health care provider.  Try to reduce your stress level, such as with yoga or meditation. Talk with your health care provider if you need help. General Instructions  Watch your dizziness for any changes.  Take medicines only as directed by your health care provider. Talk with your health care provider if you think that your dizziness is caused by a medicine that you are taking.  Tell a friend or a family  member that you are feeling dizzy. If he or she notices any changes in your behavior, have this person call your health care provider.  Keep all follow-up visits as directed by your health care provider. This is important. SEEK MEDICAL CARE IF:  Your dizziness does not go away.  Your dizziness or light-headedness gets worse.  You feel nauseous.  You have reduced hearing.  You have new symptoms.  You are unsteady on your feet or you feel like the room is spinning. SEEK IMMEDIATE MEDICAL CARE IF:  You vomit or have diarrhea and are unable to eat or drink anything.  You have problems talking, walking, swallowing, or using your arms, hands, or legs.  You feel generally weak.  You are not thinking clearly or you have trouble forming sentences. It may take a friend or family member to notice this.  You have chest pain, abdominal pain, shortness of breath, or sweating.  Your vision changes.  You notice any bleeding.  You have a headache.  You have neck pain or a stiff neck.  You have a fever.   This information is not intended to replace advice given to you by your health care provider. Make sure you discuss any questions you have with your health care provider.   Document Released: 05/25/2001 Document Revised: 04/15/2015 Document Reviewed: 11/25/2014 Elsevier Interactive Patient Education 2016 [employer].   Emergency Department Resource Guide 1) Find a Doctor and Pay Out of Pocket Although you won't have  to find out who is covered by your insurance plan, it is a good idea to ask around and get recommendations. You will then need to call the office and see if the doctor you have chosen will accept you as a new patient and what types of options they offer for patients who are self-pay. Some doctors offer discounts or will set up payment plans for their patients who do not have insurance, but you will need to ask so you aren't surprised when you get to your appointment.  2)  Contact Your Local Health Department Not all health departments have doctors that can see patients for sick visits, but many do, so it is worth a call to see if yours does. If you don't know where your local health department is, you can check in your phone book. The CDC also has a tool to help you locate your state's health department, and many state websites also have listings of all of their local health departments.  3) Find a [facility] If your illness is not likely to be very severe or complicated, you may want to try a walk in clinic. These are popping up all over the country in pharmacies, drugstores, and shopping centers. They're usually staffed by nurse practitioners or physician assistants that have been trained to treat common illnesses and complaints. They're usually fairly quick and inexpensive. However, if you have serious medical issues or chronic medical problems, these are probably not your best option.  No Primary Care Doctor: - Call Health Connect at  [phone] - they can help you locate a primary care doctor that  accepts your insurance, provides certain services, etc. - Physician Referral Service- [phone]  Chronic Pain Problems: Organization         Address  Phone   Notes  [facility]  [phone] Patients need to be referred by their primary care doctor.   Medication Assistance: Organization         Address  Phone   Notes  [facility] Medication [facility] [address] [place], [place] [address] [phone] --Must be a resident of [facility] -- Must have NO insurance coverage whatsoever (no Medicaid/ Medicare, etc.) -- The pt. MUST have a primary care doctor that directs their care regularly and follows them in the community   [facility]  [phone]   [employer]  [phone]    Agencies that provide inexpensive medical care: Organization         Address  Phone   Notes  [place]   [phone]   [facility]    [phone]   [facility] [place], [place] [address] [phone]   [place] [address], [place] [phone]   [facility]anned Parenthood    [phone]   [facility]    [phone]   [place] and [address], [place] Phone:  [phone], Fax:  [phone] Hours of Operation:  9 am - 6 pm, M-F.  Also accepts Medicaid/Medicare and self-pay.  [facility] for [place], [address], [place] Phone: [phone], Fax: [phone]. Hours of Operation:  8:30 am - 5:30 pm, M-F.  Also accepts Medicaid and self-pay.  [facility] [address], [employer] Phone: [phone]   [place],  [place], [place] [phone], Ext. [phone] Mondays & Thursdays: 7-9 AM.  First 15 patients are seen on a first come, first serve basis.    [place] Providers:  Organization         Address  Phone   Notes  [facility] [address], [place] [phone] Also accepts self-pay patients.  [facility] [address], [place]  [phone]   [place], [address], [place] [phone]   [facility] [address] [place] [phone]   Lucianne Lei [address], [place]   [phone] Only accepts [place] Access [place] patients after they have their name applied to their card.   Self-Pay (no insurance) in [facility]:  Organization         Address  Phone   Notes  Sickle Cell Patients, [facility] [place] [phone]   [facility] [place] [phone]   [facility] [place]  [place], [place], [place] [phone]   [facility]/Dr. Osei-Bonsu  [address] [place] or [address], [place] [phone] Phone number for both [place] and [place] locations is the same.  [facility] and [facility] [address], [place] [phone]   [facility] [address], [place] or [address] [phone]   [facility] [address] [place] [phone], phone; [phone], fax Sees patients 1st and 3rd Saturday of every month.  Must not qualify for public or private insurance (i.e. Medicaid, Medicare, [place] Health Choice, Veterans' Benefits)  Household income should be no more than 200% of the poverty level The clinic cannot treat you if you are pregnant or think you are pregnant  Sexually transmitted diseases are not treated at the clinic.    Dental Care: Organization         Address  Phone  Notes  [facility] Department of [facility] [place] [phone] Accepts children up to age 44 who are enrolled in [place] or [place]; pregnant women with a Medicaid card; and children who have applied for Medicaid or [place] Health Choice, but were declined, whose parents can pay a reduced fee at time of service.  [facility] Department of [facility]  [address], [place] [phone] Accepts children up to age 62 who are enrolled in [place] or [place]; pregnant women with a Medicaid card; and children who have applied for Medicaid or [place] Health Choice, but were declined, whose parents can pay a reduced fee at time of service.  [place] [facility]  [place] [phone] Patients are seen by appointment only. Walk-ins are not accepted. [place] will see patients 2 years of age and older. Monday - Tuesday  (8am-5pm) Most Wednesdays (8:30-5pm) $30 per visit, cash only  [facility] Adult [facility] Access PRO[facility]  [address], [facility] [phone] Patients are seen by appointment only. Walk-ins are not accepted. [place] will see patients 51 years of age and older. One Wednesday Evening (Monthly: Volunteer Based).  $30 per visit, cash only  [employer] of Dentistry  Clinics  [phone] for adults; Children under age 75, call Graduate Pediatric Dentistry at [phone]. Children aged 53-14, please call [phone] to request a pediatric application.  Dental services are provided in all areas of dental care including fillings, crowns and bridges, complete and partial dentures, implants, gum treatment, root canals, and extractions. Preventive care is also provided. Treatment is provided to both adults and children. Patients are selected via a lottery and there is often a waiting list.   [facility] [address], [place]  [phone] [email or web address]   [facility] [address] [place], [place] [phone], Ext. [phone] Second and Fourth Thursday of each month, opens at 6:30 AM; Clinic ends at 9 AM.  Patients are seen on a first-come first-served basis, and a limited number are seen during each clinic.   [facility]  [address] [place]ard Danker [place], [place] [phone]   Eligibility Requirements You must have lived in [place], [place], or [place] counties for at least the last three months.   You cannot be eligible for state or federal sponsored [employer], including [employer], [place], or [employer].   You generally cannot be eligible for healthcare insurance through your employer.    How to apply: Eligibility screenings are held every Tuesday and Wednesday afternoon from 1:00 pm until 4:00 pm. You do not need an appointment for the interview!  [facility] [address] [place], [place]   [place]  [place]  [place]  [phone]

## 2015-09-19 NOTE — ED Notes (Signed)
Pt came with mother who is also a patient.  Pt states that he has been getting dizzy while at work.  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  Was sent here by job to "get a paper signed".  Wants to know if he has high blood pressure or diabetes because he can "feel when his sugar drops".  Hx of vertigo in chart.

## 2018-12-08 ENCOUNTER — Ambulatory Visit (INDEPENDENT_AMBULATORY_CARE_PROVIDER_SITE_OTHER): Payer: Self-pay

## 2018-12-08 ENCOUNTER — Encounter (HOSPITAL_COMMUNITY): Payer: Self-pay

## 2018-12-08 ENCOUNTER — Emergency Department (HOSPITAL_COMMUNITY)
Admission: EM | Admit: 2018-12-08 | Discharge: 2018-12-08 | Disposition: A | Discharge status: Home / Self Care | Payer: Self-pay | Attending: Emergency Medicine | Admitting: Emergency Medicine

## 2018-12-08 ENCOUNTER — Ambulatory Visit (HOSPITAL_COMMUNITY)
Admission: EM | Admit: 2018-12-08 | Discharge: 2018-12-08 | Disposition: A | Discharge status: Home / Self Care | Payer: Self-pay | Attending: Family Medicine | Admitting: Family Medicine

## 2018-12-08 ENCOUNTER — Encounter (HOSPITAL_COMMUNITY): Payer: Self-pay | Admitting: *Deleted

## 2018-12-08 DIAGNOSIS — F99 Mental disorder, not otherwise specified: Secondary | ICD-10-CM

## 2018-12-08 DIAGNOSIS — R079 Chest pain, unspecified: Secondary | ICD-10-CM

## 2018-12-08 DIAGNOSIS — I119 Hypertensive heart disease without heart failure: Secondary | ICD-10-CM

## 2018-12-08 DIAGNOSIS — F411 Generalized anxiety disorder: Secondary | ICD-10-CM | POA: Insufficient documentation

## 2018-12-08 DIAGNOSIS — F319 Bipolar disorder, unspecified: Secondary | ICD-10-CM | POA: Insufficient documentation

## 2018-12-08 DIAGNOSIS — Z79899 Other long term (current) drug therapy: Secondary | ICD-10-CM

## 2018-12-08 DIAGNOSIS — R0789 Other chest pain: Secondary | ICD-10-CM

## 2018-12-08 DIAGNOSIS — R9431 Abnormal electrocardiogram [ECG] [EKG]: Secondary | ICD-10-CM | POA: Insufficient documentation

## 2018-12-08 DIAGNOSIS — I10 Essential (primary) hypertension: Secondary | ICD-10-CM

## 2018-12-08 LAB — CBC
HCT: 44.1 % (ref 39.0–52.0)
Hemoglobin: 14.2 g/dL (ref 13.0–17.0)
MCH: 27.5 pg (ref 26.0–34.0)
MCHC: 32.2 g/dL (ref 30.0–36.0)
MCV: 85.3 fL (ref 80.0–100.0)
Platelets: 363 10*3/uL (ref 150–400)
RBC: 5.17 MIL/uL (ref 4.22–5.81)
RDW: 12.8 % (ref 11.5–15.5)
WBC: 4.8 10*3/uL (ref 4.0–10.5)
nRBC: 0 % (ref 0.0–0.2)

## 2018-12-08 LAB — POCT I-STAT, CHEM 8
BUN: 6 mg/dL (ref 6–20)
Calcium, Ion: 1.11 mmol/L — ABNORMAL LOW (ref 1.15–1.40)
Chloride: 101 mmol/L (ref 98–111)
Creatinine, Ser: 1.1 mg/dL (ref 0.61–1.24)
GLUCOSE: 90 mg/dL (ref 70–99)
HCT: 46 % (ref 39.0–52.0)
HEMOGLOBIN: 15.6 g/dL (ref 13.0–17.0)
Potassium: 3.9 mmol/L (ref 3.5–5.1)
Sodium: 139 mmol/L (ref 135–145)
TCO2: 32 mmol/L (ref 22–32)

## 2018-12-08 LAB — COMPREHENSIVE METABOLIC PANEL
ALT: 47 U/L — AB (ref 0–44)
AST: 39 U/L (ref 15–41)
Albumin: 3.9 g/dL (ref 3.5–5.0)
Alkaline Phosphatase: 50 U/L (ref 38–126)
Anion gap: 8 (ref 5–15)
BUN: 7 mg/dL (ref 6–20)
CO2: 28 mmol/L (ref 22–32)
CREATININE: 1.15 mg/dL (ref 0.61–1.24)
Calcium: 9 mg/dL (ref 8.9–10.3)
Chloride: 102 mmol/L (ref 98–111)
GFR calc non Af Amer: >60 mL/min (ref >60)
Glucose, Bld: 91 mg/dL (ref 70–99)
Potassium: 4.4 mmol/L (ref 3.5–5.1)
Sodium: 138 mmol/L (ref 135–145)
Total Bilirubin: 1.2 mg/dL (ref 0.3–1.2)
Total Protein: 7 g/dL (ref 6.5–8.1)

## 2018-12-08 LAB — I-STAT TROPONIN, ED
Troponin i, poc: 0.01 ng/mL (ref 0.00–0.08)
Troponin i, poc: 0 ng/mL (ref 0.00–0.08)

## 2018-12-08 LAB — LIPID PANEL
Cholesterol: 206 mg/dL — ABNORMAL HIGH (ref 0–200)
HDL: 38 mg/dL — AB (ref >40)
LDL Cholesterol: 144 mg/dL — ABNORMAL HIGH (ref 0–99)
TRIGLYCERIDES: 119 mg/dL (ref <150)
Total CHOL/HDL Ratio: 5.4 RATIO
VLDL: 24 mg/dL (ref 0–40)

## 2018-12-08 LAB — HEMOGLOBIN A1C
Hgb A1c MFr Bld: 5.4 % (ref 4.8–5.6)
MEAN PLASMA GLUCOSE: 108.28 mg/dL

## 2018-12-08 MED ORDER — HYDROCHLOROTHIAZIDE 12.5 MG PO CAPS
12.5000 mg | ORAL_CAPSULE | Freq: Every day | ORAL | Status: DC
  Administered 2018-12-08: 12.5 mg via ORAL
  Filled 2018-12-08: qty 1

## 2018-12-08 MED ORDER — CLONIDINE HCL 0.1 MG PO TABS
ORAL_TABLET | ORAL | Status: AC
  Filled 2018-12-08: qty 1

## 2018-12-08 MED ORDER — ACETAMINOPHEN 500 MG PO TABS
1000.0000 mg | ORAL_TABLET | Freq: Once | ORAL | Status: AC
  Administered 2018-12-08: 1000 mg via ORAL
  Filled 2018-12-08: qty 2

## 2018-12-08 MED ORDER — CLONIDINE HCL 0.1 MG PO TABS
0.1000 mg | ORAL_TABLET | Freq: Once | ORAL | Status: AC
  Administered 2018-12-08: 0.1 mg via ORAL

## 2018-12-08 MED ORDER — LISINOPRIL 10 MG PO TABS
10.0000 mg | ORAL_TABLET | Freq: Once | ORAL | Status: AC
  Administered 2018-12-08: 10 mg via ORAL
  Filled 2018-12-08: qty 1

## 2018-12-08 MED ORDER — LISINOPRIL-HYDROCHLOROTHIAZIDE 10-12.5 MG PO TABS: 1.0000 | ORAL_TABLET | Freq: Every day | ORAL | 0 refills | Status: DC

## 2018-12-08 NOTE — ED Provider Notes (Signed)
[name]    CSN: [id number] Arrival date & time: 12/08/18  0857     History   Chief Complaint Chief Complaint  Patient presents with  . Headache  . Hypertension    HPI Nathan Bradford is a 27 y.o. male.   HPI Patient presents with a note from [facility] health requesting blood test and EKG.  Because of high risk medication, long-term antipsychotic use it was requested to have a CBC, CMP, hemoglobin A1c, lipid panel and EKG.  He also states that he has been having hypertension.  This was noted by the nurse practitioner at [facility].  She informed him that they do not treat hypertension so I need to get medical care elsewhere.  He is requesting medication for his blood pressure today.  He does not know how long he has had hypertension but thinks it is been for some time.  He has never been diagnosed with hypertension or placed on hypertension medication.  He states that he has headaches about 3 times a week.  No visual symptoms.  Headaches are relieved by Tylenol. As he was getting his work-up an EKG did show some changes.  We discussed with him that his EKG was mildly abnormal, and that his chest x-ray showed borderline LVH.  This upset patient and he states he started having some chest discomfort and pressure.  He was then transferred to the emergency department for further work-up and care. Past Medical History:  Diagnosis Date  . Anxiety   . Bipolar 1 disorder ([place])   . Depression   . Vertigo     Patient Active Problem List   Diagnosis Date Noted  . Generalized anxiety disorder 04/06/2015  . Panic disorder 04/06/2015    History reviewed. No pertinent surgical history.     Home Medications    Prior to Admission medications   Medication Sig Start Date End Date Taking? Authorizing Provider  lisinopril-hydrochlorothiazide (ZESTORETIC) 10-12.5 MG tablet Take 1 tablet by mouth daily. 12/08/18   Couture, Cortni S, PA-C    Family History History reviewed. No  pertinent family history.  Social History Social History   Tobacco Use  . Smoking status: Never Smoker  Substance Use Topics  . Alcohol use: No  . Drug use: No     Allergies   Patient has no known allergies.   Review of Systems Review of Systems  Constitutional: Negative for chills and fever.  HENT: Negative for ear pain and sore throat.   Eyes: Negative for pain and visual disturbance.  Respiratory: Negative for cough and shortness of breath.   Cardiovascular: Negative for chest pain and palpitations.  Gastrointestinal: Negative for abdominal pain and vomiting.  Genitourinary: Negative for dysuria and hematuria.  Musculoskeletal: Negative for arthralgias and back pain.  Skin: Negative for color change and rash.  Neurological: Positive for headaches. Negative for seizures and syncope.  All other systems reviewed and are negative.    Physical Exam Triage Vital Signs ED Triage Vitals  Enc Vitals Group     BP 12/08/18 0956 (!) 173/112     Pulse Rate 12/08/18 0956 95     Resp 12/08/18 0956 20     Temp 12/08/18 0956 98.3 F (36.8 C)     Temp Source 12/08/18 0956 Oral     SpO2 12/08/18 0956 93 %     Weight --      Height --      Head Circumference --      Peak Flow --  Pain Score 12/08/18 0957 8     Pain Loc --      Pain Edu? --      Excl. in [place]? --    No data found.  Updated Vital Signs BP (!) 158/111 (BP Location: Left Arm)   Pulse 95   Temp 98.3 F (36.8 C) (Oral)   Resp 20   SpO2 93%  :     Physical Exam Constitutional:      General: He is not in acute distress.    Appearance: He is well-developed. He is obese.  HENT:     Head: Normocephalic and atraumatic.  Eyes:     Extraocular Movements: Extraocular movements intact.     Conjunctiva/sclera: Conjunctivae normal.     Pupils: Pupils are equal, round, and reactive to light.  Neck:     Musculoskeletal: Normal range of motion and neck supple.  Cardiovascular:     Rate and Rhythm: Normal rate  and regular rhythm.     Heart sounds: Normal heart sounds.  Pulmonary:     Effort: Pulmonary effort is normal. No respiratory distress.     Breath sounds: Normal breath sounds.  Abdominal:     General: There is no distension.     Palpations: Abdomen is soft.  Musculoskeletal: Normal range of motion.  Skin:    General: Skin is warm and dry.  Neurological:     Mental Status: He is alert.     Deep Tendon Reflexes: Reflexes normal.  Psychiatric:        Mood and Affect: Mood normal.        Speech: Speech normal.      UC Treatments / Results  Labs (all labs ordered are listed, but only abnormal results are displayed) Labs Reviewed  COMPREHENSIVE METABOLIC PANEL - Abnormal; Notable for the following components:      Result Value   ALT 47 (*)    All other components within normal limits  LIPID PANEL - Abnormal; Notable for the following components:   Cholesterol 206 (*)    HDL 38 (*)    LDL Cholesterol 144 (*)    All other components within normal limits  POCT I-STAT, CHEM 8 - Abnormal; Notable for the following components:   Calcium, Ion 1.11 (*)    All other components within normal limits  CBC  HEMOGLOBIN A1C  I-STAT CHEM 8, ED    EKG None  Radiology Dg Chest 2 View  Result Date: 12/08/2018 CLINICAL DATA:  Untreated hypertension, now complaining of chest pain over the last month, former smoking history EXAM: CHEST - 2 VIEW COMPARISON:  None. FINDINGS: No active infiltrate or effusion is seen. There are somewhat prominent central markings which can be seen with bronchitis. Correlate clinically. Mediastinal and hilar contours are unremarkable. The heart is borderline enlarged. No bony abnormality is seen. IMPRESSION: 1. No pneumonia or effusion. 2. Can not exclude bronchitis. Electronically Signed   By: Ivar Drape M.D.   On: 12/08/2018 10:46    Procedures Procedures (including critical care time)  Medications Ordered in UC Medications  cloNIDine (CATAPRES) tablet  0.1 mg (0.1 mg Oral Given 12/08/18 1100)    Initial Impression / Assessment and Plan / UC Course  I have reviewed the triage vital signs and the nursing notes.  Pertinent labs & imaging results that were available during my care of the patient were reviewed by me and considered in my medical decision making (see chart for details).      Final  Clinical Impressions(s) / UC Diagnoses   Final diagnoses:  Hypertension, unspecified type  Hypertensive left ventricular hypertrophy, without heart failure  Nonspecific abnormal electrocardiogram (ECG) (EKG)  Chronic mental illness  High risk medication use     Discharge Instructions     Avoid salt in diet You need to call today to primary care office to set up appointment Take the BP medicine once a day in the evening Set an alarm on your phone so you do not forget You will need to come back to pick up a copy of your labs when they are available   RIGHT NOW - your EKG is abnormal and needs to be evaluated in the ER   ED Prescriptions    Medication Sig Dispense Auth. Provider   lisinopril-hydrochlorothiazide (ZESTORETIC) 10-12.5 MG tablet Take 1 tablet by mouth daily. 30 tablet Raylene Everts, MD     Controlled Substance Prescriptions [place] Controlled Substance Registry consulted? Not Applicable   Raylene Everts, MD 12/08/18 2141

## 2018-12-08 NOTE — Discharge Instructions (Signed)
Please take the blood pressure medication that you were given on your discharge paperwork as directed.  Please follow-up with your regular doctor regards your blood pressure today.  Please return to the emergency department for any severe headaches, inability to walk, vision changes, numbness or weakness, or chest pain.

## 2018-12-08 NOTE — ED Notes (Signed)
Pt verbalized understanding of discharge instructions and denies any further questions at this time.   

## 2018-12-08 NOTE — ED Triage Notes (Signed)
Pt sent from urgent care with hypertension and an abnormal EKG, pt went to urgent care to get some blood work completed for [employer], does not currently take any medication for his BP

## 2018-12-08 NOTE — ED Triage Notes (Signed)
Pt presents with elevated blood pressure and headaches.

## 2018-12-08 NOTE — ED Notes (Signed)
ED Provider at bedside. 

## 2018-12-08 NOTE — ED Provider Notes (Signed)
[place] EMERGENCY DEPARTMENT Provider Note   CSN: [id number] Arrival date & time: 12/08/18  1204     History   Chief Complaint Chief Complaint  Patient presents with  . Hypertension    HPI Nathan Bradford is a 27 y.o. male with history of bipolar 1 disorder, anxiety, depression who presents from urgent care with hypertension and abnormal EKG.  Patient reports he woke up with a headache today.  He feels it was from eating lots of sweets yesterday.  Patient also reports he began with chest pressure while he was at urgent care.  He reports it feels like something sitting on his chest.  Patient had initially gone to urgent care for medical clearance for [facility] for mental health issues.  Denies any chest pressure or shortness of breath prior to urgent care today.  That he was given clonidine given his blood pressure was elevated.  He was prescribed lisinopril-HCTZ for home.  Patient denies any drug or alcohol use, specifically cocaine.  He reports his mother recently died from stroke, but denies any first-degree relative with known cardiac history.  HPI  Past Medical History:  Diagnosis Date  . Anxiety   . Bipolar 1 disorder ([place])   . Depression   . Vertigo     Patient Active Problem List   Diagnosis Date Noted  . Generalized anxiety disorder 04/06/2015  . Panic disorder 04/06/2015    History reviewed. No pertinent surgical history.      Home Medications    Prior to Admission medications   Medication Sig Start Date End Date Taking? Authorizing Provider  lisinopril-hydrochlorothiazide (ZESTORETIC) 10-12.5 MG tablet Take 1 tablet by mouth daily. 12/08/18   Raylene Everts, MD    Family History History reviewed. No pertinent family history.  Social History Social History   Tobacco Use  . Smoking status: Never Smoker  Substance Use Topics  . Alcohol use: No  . Drug use: No     Allergies   Patient has no known allergies.   Review of  Systems Review of Systems  Constitutional: Negative for chills and fever.  HENT: Negative for facial swelling and sore throat.   Respiratory: Negative for shortness of breath.   Cardiovascular: Positive for chest pain.  Gastrointestinal: Negative for abdominal pain, nausea and vomiting.  Genitourinary: Negative for dysuria.  Musculoskeletal: Negative for back pain.  Skin: Negative for rash and wound.  Neurological: Positive for headaches.  Psychiatric/Behavioral: The patient is not nervous/anxious.      Physical Exam Updated Vital Signs BP (!) 168/117 (BP Location: Right Arm)   Pulse 92   Temp 98.4 F (36.9 C) (Oral)   Resp 18   SpO2 100%   Physical Exam Vitals signs and nursing note reviewed.  Constitutional:      General: He is not in acute distress.    Appearance: He is well-developed. He is not diaphoretic.  HENT:     Head: Normocephalic and atraumatic.     Mouth/Throat:     Pharynx: No oropharyngeal exudate.  Eyes:     General: No scleral icterus.       Right eye: No discharge.        Left eye: No discharge.     Extraocular Movements: Extraocular movements intact.     Conjunctiva/sclera: Conjunctivae normal.     Pupils: Pupils are equal, round, and reactive to light.  Neck:     Musculoskeletal: Normal range of motion and neck supple.     Thyroid:  No thyromegaly.  Cardiovascular:     Rate and Rhythm: Normal rate and regular rhythm.     Heart sounds: Normal heart sounds. No murmur. No friction rub. No gallop.   Pulmonary:     Effort: Pulmonary effort is normal. No respiratory distress.     Breath sounds: Normal breath sounds. No stridor. No wheezing or rales.  Abdominal:     General: Bowel sounds are normal. There is no distension.     Palpations: Abdomen is soft.     Tenderness: There is no abdominal tenderness. There is no guarding or rebound.  Lymphadenopathy:     Cervical: No cervical adenopathy.  Skin:    General: Skin is warm and dry.     Coloration:  Skin is not pale.     Findings: No rash.  Neurological:     Mental Status: He is alert.     Coordination: Coordination normal.     Comments: CN 3-12 intact; normal sensation throughout; 5/5 strength in all 4 extremities; equal bilateral grip strength      ED Treatments / Results  Labs (all labs ordered are listed, but only abnormal results are displayed) Labs Reviewed  I-STAT TROPONIN, ED    EKG None  Radiology Dg Chest 2 View  Result Date: 12/08/2018 CLINICAL DATA:  Untreated hypertension, now complaining of chest pain over the last month, former smoking history EXAM: CHEST - 2 VIEW COMPARISON:  None. FINDINGS: No active infiltrate or effusion is seen. There are somewhat prominent central markings which can be seen with bronchitis. Correlate clinically. Mediastinal and hilar contours are unremarkable. The heart is borderline enlarged. No bony abnormality is seen. IMPRESSION: 1. No pneumonia or effusion. 2. Can not exclude bronchitis. Electronically Signed   By: Ivar Drape M.D.   On: 12/08/2018 10:46    Procedures Procedures (including critical care time)  Medications Ordered in ED Medications  lisinopril (PRINIVIL,ZESTRIL) tablet 10 mg (has no administration in time range)  hydrochlorothiazide (MICROZIDE) capsule 12.5 mg (has no administration in time range)  acetaminophen (TYLENOL) tablet 1,000 mg (1,000 mg Oral Given 12/08/18 1441)     Initial Impression / Assessment and Plan / ED Course  I have reviewed the triage vital signs and the nursing notes.  Pertinent labs & imaging results that were available during my care of the patient were reviewed by me and considered in my medical decision making (see chart for details).     Patient presenting with hypertension associated with headache and chest pressure.  Patient had some abnormal EKG changes at urgent care.  In comparison to past, patient has had similar changes, however there little more pronounced today.   Considering chest pressure, will complete delta troponin.  Patient had CBC, CMP, lipid panel, chest x-ray at urgent care.  All unremarkable except lipid panel showed elevated LDL and total cholesterol.  Chest x-ray was clear for pneumonia or effusion.  Initial troponin negative.  Normal neuro exam without focal deficits.  Tylenol given for headache and plan to reassess.  Patient also allowed to take his home anxiety medicine.  I feel this could be related to his chest pressure, as his chest pressure did begin at urgent care after work-up and discussion of his blood pressure.  Patient will also be given lisinopril and HCTZ, his clonidine does not seem to be helping blood pressure much.  At shift change, patient care transferred to [facility], PA-C for continued evaluation, follow up of delta trop, reassessment of headache/chest pain, and determination  of disposition. I discussed patient case with Dr. Tyrone Nine who guided the patient's management and agrees with plan.  Final Clinical Impressions(s) / ED Diagnoses   Final diagnoses:  Essential hypertension  Other chest pain    ED Discharge Orders    None       Frederica Kuster, PA-C 12/08/18 [place], DO 12/08/18 1738

## 2018-12-08 NOTE — ED Provider Notes (Signed)
Patient care assumed from Armstead Peaks, PA-C at shift change with plan to f/u on delta troponin and reassess BP. If BP improves and delta trop is negative, pt felt to be safe for discharge with close f/u. He was given rx for HTN medication from urgent care. Lab work from [employer] also revealed   Per her note,  "Nathan Bradford is a 27 y.o. male with history of bipolar 1 disorder, anxiety, depression who presents from urgent care with hypertension and abnormal EKG.  Patient reports he woke up with a headache today.  He feels it was from eating lots of sweets yesterday.  Patient also reports he began with chest pressure while he was at urgent care.  He reports it feels like something sitting on his chest.  Patient had initially gone to urgent care for medical clearance for [facility] for mental health issues.  Denies any chest pressure or shortness of breath prior to urgent care today.  That he was given clonidine given his blood pressure was elevated.  He was prescribed lisinopril-HCTZ for home.  Patient denies any drug or alcohol use, specifically cocaine.  He reports his mother recently died from stroke, but denies any first-degree relative with known cardiac history."   Physical Exam  BP (!) 168/117 (BP Location: Right Arm)   Pulse 92   Temp 98.4 F (36.9 C) (Oral)   Resp 18   SpO2 100%   Physical Exam  ED Course/Procedures     Procedures  Results for orders placed or performed during the hospital encounter of 12/08/18  I-stat troponin, ED  Result Value Ref Range   Troponin i, poc 0.01 0.00 - 0.08 ng/mL   Comment 3          I-Stat Troponin, ED (not at [facility])  Result Value Ref Range   Troponin i, poc 0.00 0.00 - 0.08 ng/mL   Comment 3           Dg Chest 2 View  Result Date: 12/08/2018 CLINICAL DATA:  Untreated hypertension, now complaining of chest pain over the last month, former smoking history EXAM: CHEST - 2 VIEW COMPARISON:  None. FINDINGS: No active infiltrate or effusion is seen. There are  somewhat prominent central markings which can be seen with bronchitis. Correlate clinically. Mediastinal and hilar contours are unremarkable. The heart is borderline enlarged. No bony abnormality is seen. IMPRESSION: 1. No pneumonia or effusion. 2. Can not exclude bronchitis. Electronically Signed   By: Ivar Drape M.D.   On: 12/08/2018 10:46     MDM   Patient presenting to the ED from urgent care.  He had previously been seen for a medical clearance with plan to be discharged to follow-up with psychiatry.  He was sent to the ED due to hypertension.  While he was at urgent care he began to have chest pressure, states that he becomes anxious anytime he takes medication and he was given clonidine at urgent care.  Symptoms do not sound consistent with ACS.  EKG was reviewed and shows some flipped T waves however this is less likely felt to be secondary to ischemia.  As above, labs at urgent care were normal other than his lipid panel.  His troponin x2 here have been negative.  Chest x-ray shows no pneumonia or effusion.  Patient feeling improved in the ED.  Blood pressure has improved significantly after administration of medications.  Doubt hypertensive emergency that would require admission or further work-up at this time.  Feel the patient is  safe for discharge with close follow-up with PCP and with Rx for hypertension medication.  He will be advised to return to the ER for new or worsening symptoms in the meantime.  Case discussed with Dr. Tyrone Nine who reviewed ekg and is comfortable with plan for discharge.      Rodney Booze, PA-C 12/08/18 [place], [place], DO 12/08/18 1827

## 2018-12-08 NOTE — Discharge Instructions (Signed)
Avoid salt in diet You need to call today to primary care office to set up appointment Take the BP medicine once a day in the evening Set an alarm on your phone so you do not forget You will need to come back to pick up a copy of your labs when they are available   RIGHT NOW - your EKG is abnormal and needs to be evaluated in the ER

## 2018-12-22 ENCOUNTER — Encounter: Payer: Self-pay | Admitting: Family Medicine

## 2018-12-27 ENCOUNTER — Ambulatory Visit (INDEPENDENT_AMBULATORY_CARE_PROVIDER_SITE_OTHER): Payer: Self-pay | Admitting: Family Medicine

## 2018-12-27 ENCOUNTER — Encounter: Payer: Self-pay | Admitting: Family Medicine

## 2018-12-27 VITALS — BP 157/113 | HR 93 | Resp 17 | Ht 70.5 in | Wt 301.0 lb

## 2018-12-27 DIAGNOSIS — Z1389 Encounter for screening for other disorder: Secondary | ICD-10-CM

## 2018-12-27 DIAGNOSIS — Z7689 Persons encountering health services in other specified circumstances: Secondary | ICD-10-CM

## 2018-12-27 DIAGNOSIS — R9431 Abnormal electrocardiogram [ECG] [EKG]: Secondary | ICD-10-CM

## 2018-12-27 DIAGNOSIS — I10 Essential (primary) hypertension: Secondary | ICD-10-CM

## 2018-12-27 DIAGNOSIS — Z6841 Body Mass Index (BMI) 40.0 and over, adult: Secondary | ICD-10-CM

## 2018-12-27 DIAGNOSIS — E669 Obesity, unspecified: Secondary | ICD-10-CM

## 2018-12-27 LAB — POCT URINALYSIS DIP (CLINITEK)
Bilirubin, UA: NEGATIVE
Glucose, UA: NEGATIVE mg/dL
Ketones, POC UA: NEGATIVE mg/dL
Leukocytes, UA: NEGATIVE
Nitrite, UA: NEGATIVE
Spec Grav, UA: 1.025 (ref 1.010–1.025)
Urobilinogen, UA: 0.2 E.U./dL
pH, UA: 5.5 (ref 5.0–8.0)

## 2018-12-27 MED ORDER — LISINOPRIL-HYDROCHLOROTHIAZIDE 10-12.5 MG PO TABS: 2.0000 | ORAL_TABLET | Freq: Every day | ORAL | 0 refills | Status: DC

## 2018-12-27 NOTE — Patient Instructions (Addendum)
Thank you for choosing Primary Care at [facility] to be your medical home!    Nathan Bradford was seen by Molli Barrows, FNP today.   Jamelle Rushing primary care provider is Scot Jun, FNP.   For the best care possible, you should try to see Molli Barrows, FNP-C whenever you come to the clinic.   We look forward to seeing you again soon!  If you have any questions about your visit today, please call us at [phone] or feel free to reach your primary care provider via [place].    I have increased your medication, lisinopril- hydrochlorothiazide  to 20-25 mg once daily. This means you will take two of your tablets daily. Return on Monday fasting (no food or drink) only water, take medication prior to visit. You will have lab work completed and your blood pressure checked.    Nonspecific Chest Pain Chest pain can be caused by many different conditions. Some causes of chest pain can be life-threatening. These will require treatment right away. Serious causes of chest pain include:  Heart attack.  A tear in the body's main blood vessel.  Redness and swelling (inflammation) around your heart.  Blood clot in your lungs. Other causes of chest pain may not be so serious. These include:  Heartburn.  Anxiety or stress.  Damage to bones or muscles in your chest.  Lung infections. Chest pain can feel like:  Pain or discomfort in your chest.  Crushing, pressure, aching, or squeezing pain.  Burning or tingling.  Dull or sharp pain that is worse when you move, cough, or take a deep breath.  Pain or discomfort that is also felt in your back, neck, jaw, shoulder, or arm, or pain that spreads to any of these areas. It is hard to know whether your pain is caused by something that is serious or something that is not so serious. So it is important to see your doctor right away if you have chest pain. Follow these instructions at home: Medicines  Take over-the-counter and  prescription medicines only as told by your doctor.  If you were prescribed an antibiotic medicine, take it as told by your doctor. Do not stop taking the antibiotic even if you start to feel better. Lifestyle   Rest as told by your doctor.  Do not use any products that contain nicotine or tobacco, such as cigarettes, e-cigarettes, and chewing tobacco. If you need help quitting, ask your doctor.  Do not drink alcohol.  Make lifestyle changes as told by your doctor. These may include: ? Getting regular exercise. Ask your doctor what activities are safe for you. ? Eating a heart-healthy diet. A diet and nutrition specialist (dietitian) can help you to learn healthy eating options. ? Staying at a healthy weight. ? Treating diabetes or high blood pressure, if needed. ? Lowering your stress. Activities such as yoga and relaxation techniques can help. General instructions  Pay attention to any changes in your symptoms. Tell your doctor about them or any new symptoms.  Avoid any activities that cause chest pain.  Keep all follow-up visits as told by your doctor. This is important. You may need more testing if your chest pain does not go away. Contact a doctor if:  Your chest pain does not go away.  You feel depressed.  You have a fever. Get help right away if:  Your chest pain is worse.  You have a cough that gets worse, or you cough up blood.  You  have very bad (severe) pain in your belly (abdomen).  You pass out (faint).  You have either of these for no clear reason: ? Sudden chest discomfort. ? Sudden discomfort in your arms, back, neck, or jaw.  You have shortness of breath at any time.  You suddenly start to sweat, or your skin gets clammy.  You feel sick to your stomach (nauseous).  You throw up (vomit).  You suddenly feel lightheaded or dizzy.  You feel very weak or tired.  Your heart starts to beat fast, or it feels like it is skipping beats. These symptoms  may be an emergency. Do not wait to see if the symptoms will go away. Get medical help right away. Call your local emergency services (911 in the [place] Do not drive yourself to the hospital. Summary  Chest pain can be caused by many different conditions. The cause may be serious and need treatment right away. If you have chest pain, see your doctor right away.  Follow your doctor's instructions for taking medicines and making lifestyle changes.  Keep all follow-up visits as told by your doctor. This includes visits for any further testing if your chest pain does not go away.  Be sure to know the signs that show that your condition has become worse. Get help right away if you have these symptoms. This information is not intended to replace advice given to you by your health care provider. Make sure you discuss any questions you have with your health care provider. Document Released: 05/17/2008 Document Revised: 06/01/2018 Document Reviewed: 06/01/2018 Elsevier Interactive Patient Education  2019 [employer].  Hypertension Hypertension, commonly called high blood pressure, is when the force of blood pumping through the arteries is too strong. The arteries are the blood vessels that carry blood from the heart throughout the body. Hypertension forces the heart to work harder to pump blood and may cause arteries to become narrow or stiff. Having untreated or uncontrolled hypertension can cause heart attacks, strokes, kidney disease, and other problems. A blood pressure reading consists of a higher number over a lower number. Ideally, your blood pressure should be below 120/80. The first ("top") number is called the systolic pressure. It is a measure of the pressure in your arteries as your heart beats. The second ("bottom") number is called the diastolic pressure. It is a measure of the pressure in your arteries as the heart relaxes. What are the causes? The cause of this condition is not  known. What increases the risk? Some risk factors for high blood pressure are under your control. Others are not. Factors you can change  Smoking.  Having type 2 diabetes mellitus, high cholesterol, or both.  Not getting enough exercise or physical activity.  Being overweight.  Having too much fat, sugar, calories, or salt (sodium) in your diet.  Drinking too much alcohol. Factors that are difficult or impossible to change  Having chronic kidney disease.  Having a family history of high blood pressure.  Age. Risk increases with age.  Race. You may be at higher risk if you are African-American.  Gender. Men are at higher risk than women before age 28. After age 58, women are at higher risk than men.  Having obstructive sleep apnea.  Stress. What are the signs or symptoms? Extremely high blood pressure (hypertensive crisis) may cause:  Headache.  Anxiety.  Shortness of breath.  Nosebleed.  Nausea and vomiting.  Severe chest pain.  Jerky movements you cannot control (seizures). How is  this diagnosed? This condition is diagnosed by measuring your blood pressure while you are seated, with your arm resting on a surface. The cuff of the blood pressure monitor will be placed directly against the skin of your upper arm at the level of your heart. It should be measured at least twice using the same arm. Certain conditions can cause a difference in blood pressure between your right and left arms. Certain factors can cause blood pressure readings to be lower or higher than normal (elevated) for a short period of time:  When your blood pressure is higher when you are in a health care provider's office than when you are at home, this is called white coat hypertension. Most people with this condition do not need medicines.  When your blood pressure is higher at home than when you are in a health care provider's office, this is called masked hypertension. Most people with this  condition may need medicines to control blood pressure. If you have a high blood pressure reading during one visit or you have normal blood pressure with other risk factors:  You may be asked to return on a different day to have your blood pressure checked again.  You may be asked to monitor your blood pressure at home for 1 week or longer. If you are diagnosed with hypertension, you may have other blood or imaging tests to help your health care provider understand your overall risk for other conditions. How is this treated? This condition is treated by making healthy lifestyle changes, such as eating healthy foods, exercising more, and reducing your alcohol intake. Your health care provider may prescribe medicine if lifestyle changes are not enough to get your blood pressure under control, and if:  Your systolic blood pressure is above 130.  Your diastolic blood pressure is above 80. Your personal target blood pressure may vary depending on your medical conditions, your age, and other factors. Follow these instructions at home: Eating and drinking   Eat a diet that is high in fiber and potassium, and low in sodium, added sugar, and fat. An example eating plan is called the DASH (Dietary Approaches to Stop Hypertension) diet. To eat this way: ? Eat plenty of fresh fruits and vegetables. Try to fill half of your plate at each meal with fruits and vegetables. ? Eat whole grains, such as whole wheat pasta, brown rice, or whole grain bread. Fill about one quarter of your plate with whole grains. ? Eat or drink low-fat dairy products, such as skim milk or low-fat yogurt. ? Avoid fatty cuts of meat, processed or cured meats, and poultry with skin. Fill about one quarter of your plate with lean proteins, such as fish, chicken without skin, beans, eggs, and tofu. ? Avoid premade and processed foods. These tend to be higher in sodium, added sugar, and fat.  Reduce your daily sodium intake. Most  people with hypertension should eat less than 1,500 mg of sodium a day.  Limit alcohol intake to no more than 1 drink a day for nonpregnant women and 2 drinks a day for men. One drink equals 12 oz of beer, 5 oz of wine, or 1 oz of hard liquor. Lifestyle   Work with your health care provider to maintain a healthy body weight or to lose weight. Ask what an ideal weight is for you.  Get at least 30 minutes of exercise that causes your heart to beat faster (aerobic exercise) most days of the week. Activities may include  walking, swimming, or biking.  Include exercise to strengthen your muscles (resistance exercise), such as pilates or lifting weights, as part of your weekly exercise routine. Try to do these types of exercises for 30 minutes at least 3 days a week.  Do not use any products that contain nicotine or tobacco, such as cigarettes and e-cigarettes. If you need help quitting, ask your health care provider.  Monitor your blood pressure at home as told by your health care provider.  Keep all follow-up visits as told by your health care provider. This is important. Medicines  Take over-the-counter and prescription medicines only as told by your health care provider. Follow directions carefully. Blood pressure medicines must be taken as prescribed.  Do not skip doses of blood pressure medicine. Doing this puts you at risk for problems and can make the medicine less effective.  Ask your health care provider about side effects or reactions to medicines that you should watch for. Contact a health care provider if:  You think you are having a reaction to a medicine you are taking.  You have headaches that keep coming back (recurring).  You feel dizzy.  You have swelling in your ankles.  You have trouble with your vision. Get help right away if:  You develop a severe headache or confusion.  You have unusual weakness or numbness.  You feel faint.  You have severe pain in your  chest or abdomen.  You vomit repeatedly.  You have trouble breathing. Summary  Hypertension is when the force of blood pumping through your arteries is too strong. If this condition is not controlled, it may put you at risk for serious complications.  Your personal target blood pressure may vary depending on your medical conditions, your age, and other factors. For most people, a normal blood pressure is less than 120/80.  Hypertension is treated with lifestyle changes, medicines, or a combination of both. Lifestyle changes include weight loss, eating a healthy, low-sodium diet, exercising more, and limiting alcohol. This information is not intended to replace advice given to you by your health care provider. Make sure you discuss any questions you have with your health care provider. Document Released: 11/29/2005 Document Revised: 10/27/2016 Document Reviewed: 10/27/2016 Elsevier Interactive Patient Education  2019 [employer].

## 2018-12-27 NOTE — Progress Notes (Signed)
Nathan Bradford, is a 28 y.o. male  NFA:[id number]  ION:[id number]  DOB - 1991-04-01  CC:  Chief Complaint  Patient presents with  . Establish Care  . Follow-up    [facility]->ED 12/27: BP, chest pain, abnormal EKG       HPI: Nathan Bradford is a 28 y.o. male is here today to establish care and hypertension management.  Nathan Bradford has Generalized anxiety disorder and Panic disorder on their problem list.   Recently seen at [facility] for elevated blood pressure and request for routine screening labs. During that visit at urgent care he was routed to the ER due to an abnormal EKG and acute onset atypical chest pain. At the ER blood pressure was elevated 168/117. He was discharge after labs revealed a negative troponin. Chest pain continues which he complains of a slight pain or heaviness in mid-chest. He is not checking blood pressure at home. Endorses "dizzy spells" and at times feels like he is going to pass out.  Unable to identify any precipitating events to having this feeling.  Blood pressure on arrival today remains elevated.  He reports that he feels anxious at present.  His grandmother is accompanying him at today's visit who reports patient's mother recently passed a couple months ago at age 60.  Mother was diagnosed with cancer and high blood pressure.  At present he denies chest pain, shortness of breath, lower extremity edema or dizziness.  Patient has extensive mental health history.  He is currently followed by [facility].  Reports he gets a shot monthly however is uncertain of what that shot is.  He did not bring a comprehensive list of his medications with him to today's visit.  He endorses anxiety today.  Denies suicidal ideation, homicidal ideations, auditory hallucinations.   Current medications: Current Outpatient Medications:  .  hydrOXYzine (ATARAX/VISTARIL) 25 MG tablet, Take 25 mg by mouth 3 (three) times daily., Disp: , Rfl:  .  lisinopril-hydrochlorothiazide (ZESTORETIC)  10-12.5 MG tablet, Take 2 tablets by mouth daily., Disp: 60 tablet, Rfl: 0 .  traZODone (DESYREL) 50 MG tablet, Take 50 mg by mouth at bedtime., Disp: , Rfl:    Pertinent family medical history: family history includes Hypertension in his father and mother.  Mother died at 15 from hypertension and breast cancer.  No Known Allergies  Social History   Socioeconomic History  . Marital status: Single    Spouse name: Not on file  . Number of children: Not on file  . Years of education: Not on file  . Highest education level: Not on file  Occupational History  . Not on file  Social Needs  . Financial resource strain: Not on file  . Food insecurity:    Worry: Not on file    Inability: Not on file  . Transportation needs:    Medical: Not on file    Non-medical: Not on file  Tobacco Use  . Smoking status: Never Smoker  . Smokeless tobacco: Never Used  Substance and Sexual Activity  . Alcohol use: No  . Drug use: No  . Sexual activity: Not on file  Lifestyle  . Physical activity:    Days per week: Not on file    Minutes per session: Not on file  . Stress: Not on file  Relationships  . Social connections:    Talks on phone: Not on file    Gets together: Not on file    Attends religious service: Not on file    Active  member of club or organization: Not on file    Attends meetings of clubs or organizations: Not on file    Relationship status: Not on file  . Intimate partner violence:    Fear of current or ex partner: Not on file    Emotionally abused: Not on file    Physically abused: Not on file    Forced sexual activity: Not on file  Other Topics Concern  . Not on file  Social History Narrative  . Not on file    Review of Systems: Pertinent negatives listed in HPI Objective:   Vitals:   12/27/18 1338  BP: (!) 160/110  Pulse: 93  Resp: 17  SpO2: 97%    BP Readings from Last 3 Encounters:  12/27/18 (!) 160/110  12/08/18 (!) 148/100  12/08/18 (!) 158/111     Filed Weights   12/27/18 1338  Weight: (!) 301 lb (136.5 kg)      Physical Exam: Constitutional: Patient appears well-developed and well-nourished. No distress. HENT: Normocephalic, atraumatic, External right and left ear normal.  Eyes: Conjunctivae and EOM are normal. PERRLA, no scleral icterus. Neck: Normal ROM. Neck supple. No JVD. No tracheal deviation. No thyromegaly. CVS: RRR, S1/S2 +, no murmurs, no gallops, no carotid bruit.  Pulmonary: Effort and breath sounds normal, no stridor, rhonchi, wheezes, rales.  Abdominal: Soft. BS +, no distension, tenderness, rebound or guarding.  Musculoskeletal: Normal range of motion. No edema and no tenderness.  Neuro: Alert. Normal muscle tone coordination. Normal gait. Skin: Skin is warm and dry. No rash noted. Not diaphoretic. No erythema. No pallor. Psychiatric: Normal mood and affect. Behavior, judgment, thought content normal.  Lab Results (prior encounters)  Lab Results  Component Value Date   WBC 4.8 12/08/2018   HGB 15.6 12/08/2018   HCT 46.0 12/08/2018   MCV 85.3 12/08/2018   PLT 363 12/08/2018   Lab Results  Component Value Date   CREATININE 1.10 12/08/2018   BUN 6 12/08/2018   NA 139 12/08/2018   K 3.9 12/08/2018   CL 101 12/08/2018   CO2 28 12/08/2018    Lab Results  Component Value Date   HGBA1C 5.4 12/08/2018       Component Value Date/Time   CHOL 206 (H) 12/08/2018 1032   TRIG 119 12/08/2018 1032   HDL 38 (L) 12/08/2018 1032   CHOLHDL 5.4 12/08/2018 1032   VLDL 24 12/08/2018 1032   LDLCALC 144 (H) 12/08/2018 1032        Assessment and plan:  1. Encounter to establish care 2. Essential hypertension, uncontrolled Increased- lisinopril hydrochlorothiazide 20-25 mg once daily.  Advised to take another tablet upon arriving home today. Intolerance of clonidine it makes him have increased anxiety therefore not given during office visit today. Patient to return in 5 days blood pressure check and fasting  labs.  3. Nonspecific ST-T wave electrocardiographic changes Patient has been referred to cardiology emergently as he continues to have episodic chest pain and has an abnormal EKG.  He is excessively hypertensive today although endorses taking his medication.  See essential hypertension.- Ambulatory referral to Cardiology  4. BMI 40.0-44.9, adult ([place]) Encouraged efforts to reduce weight include engaging in physical activity as tolerated with goal of 150 minutes per week. Improve dietary choices and eat a meal regimen consistent with a Mediterranean or DASH diet. Reduce simple carbohydrates. Do not skip meals and eat healthy snacks throughout the day to avoid over-eating at dinner. Set a goal weight loss that is achievable  for you. Checking: - Hemoglobin A1c; Future  Meds ordered this encounter  Medications  . DISCONTD: lisinopril-hydrochlorothiazide (ZESTORETIC) 10-12.5 MG tablet    Sig: Take 2 tablets by mouth daily.    Dispense:  60 tablet    Refill:  0  . lisinopril-hydrochlorothiazide (ZESTORETIC) 10-12.5 MG tablet    Sig: Take 2 tablets by mouth daily.    Dispense:  60 tablet    Refill:  0     Return in about 5 days (around 01/01/2019) for Nurse visit BP check and fasting labs 5 days and  4 weeks provider HTN.   The patient was given clear instructions to go to ER or return to medical center if symptoms don't improve, worsen or new problems develop. The patient verbalized understanding. The patient was advised  to call and obtain lab results if they haven't heard anything from out office within 7-10 business days.  Molli Barrows, FNP Primary Care at [facility] [address], [place] [address] [phone]: [phone]    This note has been created with Dragon speech recognition software and Engineer, materials. Any transcriptional errors are unintentional.

## 2018-12-30 DIAGNOSIS — R9431 Abnormal electrocardiogram [ECG] [EKG]: Secondary | ICD-10-CM | POA: Insufficient documentation

## 2018-12-30 DIAGNOSIS — Z6841 Body Mass Index (BMI) 40.0 and over, adult: Secondary | ICD-10-CM | POA: Insufficient documentation

## 2018-12-30 DIAGNOSIS — I10 Essential (primary) hypertension: Secondary | ICD-10-CM | POA: Insufficient documentation

## 2019-01-01 ENCOUNTER — Ambulatory Visit (INDEPENDENT_AMBULATORY_CARE_PROVIDER_SITE_OTHER): Payer: Self-pay | Admitting: Family Medicine

## 2019-01-01 DIAGNOSIS — I10 Essential (primary) hypertension: Secondary | ICD-10-CM

## 2019-01-01 DIAGNOSIS — Z6841 Body Mass Index (BMI) 40.0 and over, adult: Secondary | ICD-10-CM

## 2019-01-01 MED ORDER — CARVEDILOL 6.25 MG PO TABS: 6.2500 mg | ORAL_TABLET | Freq: Two times a day (BID) | ORAL | 3 refills | Status: DC

## 2019-01-01 NOTE — Progress Notes (Signed)
CC: BP Check  Nathan Bradford, 28 y.o male newly diagnosed HTN.  Initially presented today for blood pressure recheck after medications had been adjusted during his previous visit on 12/27/2018.  He has taken medication prior to visit today and his initial blood pressure reading was elevated 155/106.  Repeat blood pressure after 15 minutes was 148/100 .  Made adjustments as follows he will continue his current prescription of lisinopril HCTZ 20-25 mg and I am adding carvedilol 6.25 mg twice daily.  He is to return in 4 weeks and take medications prior to visit.  He is asymptomatic of chest pain or shortness of breath.  Although has had the symptoms during his prior visit he had been referred to cardiology.  We will follow-up on cardiology referral as he has not been contacted as of yet.  Pertinent negatives listed in HPI  Physical Exam: General appearance: alert, well developed, well nourished, cooperative and in no distress Head: Normocephalic, without obvious abnormality, atraumatic Respiratory: Respirations even and unlabored, normal respiratory rate Extremities: No gross deformities Skin: Skin color, texture, turgor normal. No rashes seen  Psych: Appropriate mood and affect. Neurologic: Mental status: Alert, oriented to person, place, and time, thought content appropriate.   Assessment: BP elevated today x 2 readings 155/106 and 148/100  Plan: Will add carvedilol 6.25 BID to regimen and follow-up in 4 weeks.  Molli Barrows, FNP Primary Care at [facility] [address] [place] [phone]: [phone]

## 2019-01-02 LAB — COMPREHENSIVE METABOLIC PANEL
ALT: 24 IU/L (ref 0–44)
AST: 19 IU/L (ref 0–40)
Albumin/Globulin Ratio: 1.6 (ref 1.2–2.2)
Albumin: 4.5 g/dL (ref 4.1–5.2)
Alkaline Phosphatase: 60 IU/L (ref 39–117)
BUN/Creatinine Ratio: 10 (ref 9–20)
BUN: 12 mg/dL (ref 6–20)
Bilirubin Total: 0.6 mg/dL (ref 0.0–1.2)
CO2: 25 mmol/L (ref 20–29)
Calcium: 9.6 mg/dL (ref 8.7–10.2)
Chloride: 98 mmol/L (ref 96–106)
Creatinine, Ser: 1.19 mg/dL (ref 0.76–1.27)
GFR calc non Af Amer: 84 mL/min/{1.73_m2} (ref >59)
GFR, EST AFRICAN AMERICAN: 97 mL/min/{1.73_m2} (ref >59)
GLOBULIN, TOTAL: 2.8 g/dL (ref 1.5–4.5)
Glucose: 91 mg/dL (ref 65–99)
Potassium: 4.3 mmol/L (ref 3.5–5.2)
SODIUM: 141 mmol/L (ref 134–144)
TOTAL PROTEIN: 7.3 g/dL (ref 6.0–8.5)

## 2019-01-02 LAB — LIPID PANEL
CHOL/HDL RATIO: 5.5 ratio — AB (ref 0.0–5.0)
Cholesterol, Total: 208 mg/dL — ABNORMAL HIGH (ref 100–199)
HDL: 38 mg/dL — ABNORMAL LOW (ref >39)
LDL Calculated: 149 mg/dL — ABNORMAL HIGH (ref 0–99)
Triglycerides: 105 mg/dL (ref 0–149)
VLDL Cholesterol Cal: 21 mg/dL (ref 5–40)

## 2019-01-02 LAB — THYROID PANEL WITH TSH
Free Thyroxine Index: 1.6 (ref 1.2–4.9)
T3 Uptake Ratio: 26 % (ref 24–39)
T4, Total: 6.2 ug/dL (ref 4.5–12.0)
TSH: 1.67 u[IU]/mL (ref 0.450–4.500)

## 2019-01-02 LAB — HEMOGLOBIN A1C
Est. average glucose Bld gHb Est-mCnc: 123 mg/dL
Hgb A1c MFr Bld: 5.9 % — ABNORMAL HIGH (ref 4.8–5.6)

## 2019-01-04 MED ORDER — ATORVASTATIN CALCIUM 20 MG PO TABS: 20.0000 mg | ORAL_TABLET | Freq: Every day | ORAL | 3 refills | Status: DC

## 2019-01-04 MED ORDER — METFORMIN HCL 500 MG PO TABS: 500.0000 mg | ORAL_TABLET | Freq: Every day | ORAL | 3 refills | Status: DC

## 2019-01-04 NOTE — Addendum Note (Signed)
Addended by: Scot Jun on: 01/04/2019 08:55 PM   Modules accepted: Orders

## 2019-01-05 ENCOUNTER — Telehealth: Payer: Self-pay | Admitting: Family Medicine

## 2019-01-05 NOTE — Telephone Encounter (Signed)
Mail form [facility]. Return a copy to provider.

## 2019-01-05 NOTE — Telephone Encounter (Signed)
Left voice mail to call back 

## 2019-01-05 NOTE — Telephone Encounter (Signed)
The patient is clear to work from my medical perspective. He has not been taking out of work for any reason related to blood pressure.

## 2019-01-05 NOTE — Telephone Encounter (Signed)
Patient is requesting letter due to BP.

## 2019-01-05 NOTE — Telephone Encounter (Signed)
Patient called stating that he would like a letter stating that he is unable to work at the moment. Patient would also like a call back from the [place]. Please follow up.

## 2019-01-09 ENCOUNTER — Telehealth: Payer: Self-pay | Admitting: Family Medicine

## 2019-01-09 NOTE — Telephone Encounter (Signed)
Patient is requesting a call back from the nurse or provider. Please follow up.

## 2019-01-09 NOTE — Progress Notes (Signed)
I called [facility] and the sooner appointment they have 01/19/2019 @ 2pm at [facility] and I contact the patient and he is aware  Of his  appointment .

## 2019-01-11 NOTE — Telephone Encounter (Signed)
Patient notified

## 2019-01-11 NOTE — Progress Notes (Signed)
Patient notified of results & recommendations. Expressed understanding.

## 2019-01-19 ENCOUNTER — Encounter: Payer: Self-pay | Admitting: Cardiology

## 2019-01-19 ENCOUNTER — Ambulatory Visit (INDEPENDENT_AMBULATORY_CARE_PROVIDER_SITE_OTHER): Payer: Self-pay | Admitting: Cardiology

## 2019-01-19 ENCOUNTER — Encounter: Payer: Self-pay | Admitting: *Deleted

## 2019-01-19 VITALS — BP 152/80 | HR 95 | Ht 70.5 in | Wt 305.0 lb

## 2019-01-19 DIAGNOSIS — E782 Mixed hyperlipidemia: Secondary | ICD-10-CM

## 2019-01-19 DIAGNOSIS — R0789 Other chest pain: Secondary | ICD-10-CM

## 2019-01-19 DIAGNOSIS — E088 Diabetes mellitus due to underlying condition with unspecified complications: Secondary | ICD-10-CM

## 2019-01-19 DIAGNOSIS — R079 Chest pain, unspecified: Secondary | ICD-10-CM

## 2019-01-19 DIAGNOSIS — Z6841 Body Mass Index (BMI) 40.0 and over, adult: Secondary | ICD-10-CM

## 2019-01-19 DIAGNOSIS — R9431 Abnormal electrocardiogram [ECG] [EKG]: Secondary | ICD-10-CM

## 2019-01-19 DIAGNOSIS — I10 Essential (primary) hypertension: Secondary | ICD-10-CM

## 2019-01-19 MED ORDER — CARVEDILOL 12.5 MG PO TABS: 12.5000 mg | ORAL_TABLET | Freq: Two times a day (BID) | ORAL | 3 refills | Status: DC

## 2019-01-19 NOTE — Progress Notes (Signed)
Cardiology Office Note:    Date:  01/19/2019   ID:  Nathan Bradford, DOB 1991/10/11, MRN [id number]  PCP:  Scot Jun, FNP  Cardiologist:  Jenean Lindau, MD   Referring MD: Scot Jun, FNP    ASSESSMENT:    1. Chest discomfort   2. Essential hypertension   3. Nonspecific ST-T wave electrocardiographic changes   4. BMI 40.0-44.9, adult ([place])   5. Diabetes mellitus due to underlying condition with unspecified complications ([place])   6. Chest pain, unspecified type    PLAN:    In order of problems listed above:  1. Primary prevention stressed with the patient.  Importance of compliance with diet and medication stressed and he vocalized understanding.  His blood pressure is stable.  Diet was discussed for dyslipidemia and diabetes mellitus and obesity.  Risks of obesity explained and he vocalized understanding.  His blood pressure is elevated so I will double of his beta-blocker. 2. Echocardiogram will be done to assess murmur heard on auscultation.  He will undergo exercise stress Cardiolite to assess his symptoms of chest discomfort in view of the fact that he has multiple risk factors for coronary artery disease and leads a high risk lifestyle. 3. He knows to go to the nearest emergency room for any concerning symptoms.Patient will be seen in follow-up appointment in 6 months or earlier if the patient has any concerns    Medication Adjustments/Labs and Tests Ordered: Current medicines are reviewed at length with the patient today.  Concerns regarding medicines are outlined above.  Orders Placed This Encounter  Procedures  . MYOCARDIAL PERFUSION IMAGING  . ECHOCARDIOGRAM COMPLETE   Meds ordered this encounter  Medications  . carvedilol (COREG) 12.5 MG tablet    Sig: Take 1 tablet (12.5 mg total) by mouth 2 (two) times daily with a meal.    Dispense:  60 tablet    Refill:  3     History of Present Illness:    Nathan Bradford is a 28 y.o. male who is being seen  today for the evaluation of chest discomfort at the request of Scot Jun, FNP.  Patient is a pleasant 28 year old male.  He has past medical history of essential hypertension, dyslipidemia, diabetes mellitus, morbid obesity.  He mentions to me that he has mental health issues including schizophrenia.  The patient mentions to me that he is here for evaluation.  His blood pressure is elevated.  He also has chest discomfort at times.  This is not related to exertion.  No orthopnea or PND.  He leads a sedentary lifestyle.  He mentions to me that sexual activity does not increase his chest pain.  At the time of my evaluation, the patient is alert awake oriented and in no distress.  Past Medical History:  Diagnosis Date  . Anxiety   . Bipolar 1 disorder ([place])   . Depression   . Vertigo     Past Surgical History:  Procedure Laterality Date  . WISDOM TOOTH EXTRACTION      Current Medications: Current Meds  Medication Sig  . atorvastatin (LIPITOR) 20 MG tablet Take 1 tablet (20 mg total) by mouth daily.  . carvedilol (COREG) 12.5 MG tablet Take 1 tablet (12.5 mg total) by mouth 2 (two) times daily with a meal.  . FLUoxetine (PROZAC) 20 MG capsule Take 20 mg by mouth daily.  . hydrOXYzine (ATARAX/VISTARIL) 25 MG tablet Take 25 mg by mouth 3 (three) times daily.  [name] lisinopril-hydrochlorothiazide (ZESTORETIC)  10-12.5 MG tablet Take 2 tablets by mouth daily.  . metFORMIN (GLUCOPHAGE) 500 MG tablet Take 1 tablet (500 mg total) by mouth daily with breakfast.  . traZODone (DESYREL) 50 MG tablet Take 50 mg by mouth at bedtime.  . [DISCONTINUED] carvedilol (COREG) 6.25 MG tablet Take 1 tablet (6.25 mg total) by mouth 2 (two) times daily with a meal.     Allergies:   Patient has no known allergies.   Social History   Socioeconomic History  . Marital status: Single    Spouse name: Not on file  . Number of children: Not on file  . Years of education: Not on file  . Highest education level:  Not on file  Occupational History  . Not on file  Social Needs  . Financial resource strain: Not on file  . Food insecurity:    Worry: Not on file    Inability: Not on file  . Transportation needs:    Medical: Not on file    Non-medical: Not on file  Tobacco Use  . Smoking status: Never Smoker  . Smokeless tobacco: Never Used  Substance and Sexual Activity  . Alcohol use: No  . Drug use: No  . Sexual activity: Not on file  Lifestyle  . Physical activity:    Days per week: Not on file    Minutes per session: Not on file  . Stress: Not on file  Relationships  . Social connections:    Talks on phone: Not on file    Gets together: Not on file    Attends religious service: Not on file    Active member of club or organization: Not on file    Attends meetings of clubs or organizations: Not on file    Relationship status: Not on file  Other Topics Concern  . Not on file  Social History Narrative  . Not on file     Family History: The patient's family history includes Hypertension in his father and mother.  ROS:   Please see the history of present illness.    All other systems reviewed and are negative.  EKGs/Labs/Other Studies Reviewed:    The following studies were reviewed today: EKG reveals sinus rhythm and nonspecific ST-T changes.   Recent Labs: 12/08/2018: Hemoglobin 15.6; Platelets 363 01/01/2019: ALT 24; BUN 12; Creatinine, Ser 1.19; Potassium 4.3; Sodium 141; TSH 1.670  Recent Lipid Panel    Component Value Date/Time   CHOL 208 (H) 01/01/2019 0839   TRIG 105 01/01/2019 0839   HDL 38 (L) 01/01/2019 0839   CHOLHDL 5.5 (H) 01/01/2019 0839   CHOLHDL 5.4 12/08/2018 1032   VLDL 24 12/08/2018 1032   LDLCALC 149 (H) 01/01/2019 0839    Physical Exam:    VS:  BP (!) 152/80 (BP Location: Right Arm, Patient Position: Sitting, Cuff Size: Normal)   Pulse 95   Ht 5' 10.5" (1.791 m)   Wt (!) 305 lb (138.3 kg)   SpO2 98%   BMI 43.14 kg/m     Wt Readings from  Last 3 Encounters:  01/19/19 (!) 305 lb (138.3 kg)  12/27/18 (!) 301 lb (136.5 kg)  12/06/11 206 lb (93.4 kg) (94 %, Z= 1.54)*   * Growth percentiles are based on CDC (Boys, 2-20 Years) data.     GEN: Patient is in no acute distress HEENT: Normal NECK: No JVD; No carotid bruits LYMPHATICS: No lymphadenopathy CARDIAC: S1 S2 regular, 2/6 systolic murmur at the apex. RESPIRATORY:  Clear to auscultation  without rales, wheezing or rhonchi  ABDOMEN: Soft, non-tender, non-distended MUSCULOSKELETAL:  No edema; No deformity  SKIN: Warm and dry NEUROLOGIC:  Alert and oriented x 3 PSYCHIATRIC:  Normal affect    Signed, Jenean Lindau, MD  01/19/2019 2:44 PM    [facility]

## 2019-01-19 NOTE — Patient Instructions (Addendum)
Medication Instructions:  Your physician has recommended you make the following change in your medication:   INCREASE carvedilol (coreg) 12.5 mg: Take 1 tablet twice daily   If you need a refill on your cardiac medications before your next appointment, please call your pharmacy.   Lab work: None  If you have labs (blood work) drawn today and your tests are completely normal, you will receive your results only by: [name] MyChart Message (if you have MyChart) OR . A paper copy in the mail If you have any lab test that is abnormal or we need to change your treatment, we will call you to review the results.  Testing/Procedures: Your physician has requested that you have an echocardiogram. Echocardiography is a painless test that uses sound waves to create images of your heart. It provides your doctor with information about the size and shape of your heart and how well your heart's chambers and valves are working. This procedure takes approximately one hour. There are no restrictions for this procedure.  Your physician has requested that you have en exercise stress myoview. For further information please visit [email or web address]. Please follow instruction sheet, as given.  Follow-Up: At [facility], you and your health needs are our priority.  As part of our continuing mission to provide you with exceptional heart care, we have created designated Provider Care Teams.  These Care Teams include your primary Cardiologist (physician) and Advanced Practice Providers (APPs -  Physician Assistants and Nurse Practitioners) who all work together to provide you with the care you need, when you need it. You will need a follow up appointment in 2 months.     Echocardiogram An echocardiogram is a procedure that uses painless sound waves (ultrasound) to produce an image of the heart. Images from an echocardiogram can provide important information about:  Signs of coronary artery disease (CAD).  Aneurysm  detection. An aneurysm is a weak or damaged part of an artery wall that bulges out from the normal force of blood pumping through the body.  Heart size and shape. Changes in the size or shape of the heart can be associated with certain conditions, including heart failure, aneurysm, and CAD.  Heart muscle function.  Heart valve function.  Signs of a past heart attack.  Fluid buildup around the heart.  Thickening of the heart muscle.  A tumor or infectious growth around the heart valves. Tell a health care provider about:  Any allergies you have.  All medicines you are taking, including vitamins, herbs, eye drops, creams, and over-the-counter medicines.  Any blood disorders you have.  Any surgeries you have had.  Any medical conditions you have.  Whether you are pregnant or may be pregnant. What are the risks? Generally, this is a safe procedure. However, problems may occur, including:  Allergic reaction to dye (contrast) that may be used during the procedure. What happens before the procedure? No specific preparation is needed. You may eat and drink normally. What happens during the procedure?   An IV tube may be inserted into one of your veins.  You may receive contrast through this tube. A contrast is an injection that improves the quality of the pictures from your heart.  A gel will be applied to your chest.  A wand-like tool (transducer) will be moved over your chest. The gel will help to transmit the sound waves from the transducer.  The sound waves will harmlessly bounce off of your heart to allow the heart images to be captured  in real-time motion. The images will be recorded on a computer. The procedure may vary among health care providers and hospitals. What happens after the procedure?  You may return to your normal, everyday life, including diet, activities, and medicines, unless your health care provider tells you not to do that. Summary  An  echocardiogram is a procedure that uses painless sound waves (ultrasound) to produce an image of the heart.  Images from an echocardiogram can provide important information about the size and shape of your heart, heart muscle function, heart valve function, and fluid buildup around your heart.  You do not need to do anything to prepare before this procedure. You may eat and drink normally.  After the echocardiogram is completed, you may return to your normal, everyday life, unless your health care provider tells you not to do that. This information is not intended to replace advice given to you by your health care provider. Make sure you discuss any questions you have with your health care provider. Document Released: 11/26/2000 Document Revised: 01/01/2017 Document Reviewed: 01/01/2017 Elsevier Interactive Patient Education  2019 [employer].   Exercise Stress Test An exercise stress test is a test to check how your heart works during exercise. You will need to walk on a treadmill or ride an exercise bike for this test. An electrocardiogram (ECG) will record your heartbeat when you are at rest and when you are exercising. You may have an ultrasound or nuclear test after the exercise test. The test is done to check for coronary artery disease (CAD). It is also done to:  See how well you can exercise.  Watch for high blood pressure during exercise.  Test how well you can exercise after treatment.  Check the blood flow to your arms and legs. If your test result is not normal, more testing may be needed. What happens before the procedure?  Follow instructions from your doctor about what you cannot eat or drink. ? Do not have any drinks or foods that have caffeine in them for 24 hours before the test, or as told by your doctor. This includes coffee, tea (even decaf tea), sodas, chocolate, and cocoa.  Ask your doctor about changing or stopping your normal medicines. This is important if  you: ? Take diabetes medicines. ? Take beta-blocker medicines. ? Wear a nitroglycerin patch.  If you use an inhaler, bring it with you to the test.  Do not put lotions, powders, creams, or oils on your chest before the test.  Wear comfortable shoes and clothing.  Do not use any products that have nicotine or tobacco in them, such as cigarettes and e-cigarettes. Stop using them at least 4 hours before the test. If you need help quitting, ask your doctor. What happens during the procedure?  Patches (electrodes) will be put on your chest.  Wires will be connected to the patches. The wires will send signals to a machine to record your heartbeat.  Your heart rate will be watched while you are resting and while you are exercising. Your blood pressure will also be watched during the test.  You will walk on a treadmill or use a stationary bike. If you cannot use these, you may be asked to turn a crank with your hands.  The activity will get harder and will raise your heart rate.  You may be asked to breathe into a tube a few times during the test. This measures the gases that you breathe out.  You will be  asked how you are feeling throughout the test.  You will exercise until your heart reaches a target heart rate. You will stop early if: ? You feel dizzy. ? You have chest pain. ? You are out of breath. ? Your blood pressure is too high or too low. ? You have an irregular heartbeat. ? You have pain or aching in your arms or legs. The procedure may vary among doctors and hospitals. What happens after the procedure?  Your blood pressure, heart rate, breathing rate, and blood oxygen level will be watched after the test.  You may return to your normal diet and activities as told by your doctor.  It is up to you to get the results of your test. Ask your doctor, or the department that is doing the test, when your results will be ready. Summary  An exercise stress test is a test to  check how your heart works during exercise.  This test is done to check for coronary artery disease.  Your heart rate will be watched while you are resting and while you are exercising.  Follow instructions from your doctor about what you cannot eat or drink before the test. This information is not intended to replace advice given to you by your health care provider. Make sure you discuss any questions you have with your health care provider. Document Released: 05/17/2008 Document Revised: 03/01/2017 Document Reviewed: 03/01/2017 Elsevier Interactive Patient Education  2019 [employer].

## 2019-01-29 ENCOUNTER — Ambulatory Visit (HOSPITAL_COMMUNITY): Payer: Self-pay | Attending: Cardiology

## 2019-01-29 DIAGNOSIS — R079 Chest pain, unspecified: Secondary | ICD-10-CM

## 2019-01-29 DIAGNOSIS — I10 Essential (primary) hypertension: Secondary | ICD-10-CM

## 2019-02-01 ENCOUNTER — Telehealth (HOSPITAL_COMMUNITY): Payer: Self-pay | Admitting: *Deleted

## 2019-02-01 NOTE — Telephone Encounter (Signed)
Patient given detailed instructions per Myocardial Perfusion Study Information Sheet for the test on 02/06/19. Patient notified to arrive 15 minutes early and that it is imperative to arrive on time for appointment to keep from having the test rescheduled.  If you need to cancel or reschedule your appointment, please call the office within 24 hours of your appointment. . Patient verbalized understanding.Nathan Bradford    

## 2019-02-05 ENCOUNTER — Encounter: Payer: Self-pay | Admitting: Family Medicine

## 2019-02-05 ENCOUNTER — Ambulatory Visit: Payer: Self-pay | Admitting: Family Medicine

## 2019-02-05 NOTE — Progress Notes (Signed)
Patient has a stress test scheduled for tomorrow. Not taking medication do to procedure. Unable to address BP today. Recommend follow-up on 03/09/19

## 2019-02-06 ENCOUNTER — Ambulatory Visit (HOSPITAL_COMMUNITY): Payer: Self-pay | Attending: Cardiology

## 2019-02-06 VITALS — Ht 70.0 in | Wt 305.0 lb

## 2019-02-06 DIAGNOSIS — R9431 Abnormal electrocardiogram [ECG] [EKG]: Secondary | ICD-10-CM | POA: Insufficient documentation

## 2019-02-06 DIAGNOSIS — R079 Chest pain, unspecified: Secondary | ICD-10-CM

## 2019-02-06 DIAGNOSIS — R0789 Other chest pain: Secondary | ICD-10-CM | POA: Insufficient documentation

## 2019-02-06 MED ORDER — TECHNETIUM TC 99M TETROFOSMIN IV KIT
31.7000 | PACK | Freq: Once | INTRAVENOUS | Status: AC | PRN
  Administered 2019-02-06: 31.7 via INTRAVENOUS
  Filled 2019-02-06: qty 32

## 2019-02-07 ENCOUNTER — Ambulatory Visit (HOSPITAL_COMMUNITY): Payer: Self-pay | Attending: Internal Medicine

## 2019-02-07 ENCOUNTER — Telehealth: Payer: Self-pay | Admitting: Family Medicine

## 2019-02-07 LAB — MYOCARDIAL PERFUSION IMAGING
CHL CUP RESTING HR STRESS: 104 {beats}/min
Estimated workload: 7 METS
Exercise duration (min): 6 min
Exercise duration (sec): 0 s
LV dias vol: 106 mL (ref 62–150)
LV sys vol: 48 mL
MPHR: 193 {beats}/min
Peak HR: 179 {beats}/min
Percent HR: 92 %
SDS: 2
SRS: 0
SSS: 2
TID: 0.76

## 2019-02-07 MED ORDER — TECHNETIUM TC 99M TETROFOSMIN IV KIT
32.5000 | PACK | Freq: Once | INTRAVENOUS | Status: AC | PRN
  Administered 2019-02-07: 32.5 via INTRAVENOUS
  Filled 2019-02-07: qty 33

## 2019-02-07 NOTE — Telephone Encounter (Signed)
Caller Name: Shai Mckenzie  Reason for Call:  Request for accu-check meter    If this is a medication request: confirm pharmacy  [facility] [address]  Verify call back number: [phone]    Action taken by recipient of request:

## 2019-02-07 NOTE — Telephone Encounter (Signed)
Patient was told at his visit on 02/05/2019 that he does not need a meter b/c he is not diabetic.

## 2019-02-08 ENCOUNTER — Telehealth: Payer: Self-pay

## 2019-02-08 NOTE — Telephone Encounter (Signed)
-----   Message from Jenean Lindau, MD sent at 02/08/2019 10:10 AM EST ----- The results of the study is unremarkable. Please inform patient. I will discuss in detail at next appointment. Cc  primary care/referring physician Jenean Lindau, MD 02/08/2019 10:10 AM

## 2019-02-08 NOTE — Telephone Encounter (Signed)
Patient called and notified of test results. 

## 2019-03-08 ENCOUNTER — Other Ambulatory Visit: Payer: Self-pay | Admitting: Family Medicine

## 2019-03-08 ENCOUNTER — Telehealth: Payer: Self-pay

## 2019-03-08 NOTE — Telephone Encounter (Signed)
Called patient to do his pre-visit COVID screening  Patient states that he hasn't recently traveled internationally or to any hot spots in the [place].  Denies fever, cough, body aches, SHOB.  Has not had any contact with anybody who has recently traveled or has been diagnosed with COVID or experiencing fever, cough or SHOB.

## 2019-03-09 ENCOUNTER — Ambulatory Visit (INDEPENDENT_AMBULATORY_CARE_PROVIDER_SITE_OTHER): Payer: Self-pay | Admitting: Family Medicine

## 2019-03-09 ENCOUNTER — Other Ambulatory Visit: Payer: Self-pay

## 2019-03-09 ENCOUNTER — Telehealth: Payer: Self-pay | Admitting: Family Medicine

## 2019-03-09 ENCOUNTER — Encounter: Payer: Self-pay | Admitting: Family Medicine

## 2019-03-09 VITALS — BP 138/93 | HR 81 | Temp 98.2°F | Resp 17 | Ht 70.5 in | Wt 311.6 lb

## 2019-03-09 DIAGNOSIS — E785 Hyperlipidemia, unspecified: Secondary | ICD-10-CM

## 2019-03-09 DIAGNOSIS — I10 Essential (primary) hypertension: Secondary | ICD-10-CM

## 2019-03-09 DIAGNOSIS — Z6841 Body Mass Index (BMI) 40.0 and over, adult: Secondary | ICD-10-CM

## 2019-03-09 DIAGNOSIS — E669 Obesity, unspecified: Secondary | ICD-10-CM

## 2019-03-09 DIAGNOSIS — R7303 Prediabetes: Secondary | ICD-10-CM

## 2019-03-09 DIAGNOSIS — J302 Other seasonal allergic rhinitis: Secondary | ICD-10-CM

## 2019-03-09 MED ORDER — LORATADINE 10 MG PO TABS: 10.0000 mg | ORAL_TABLET | Freq: Every day | ORAL | 11 refills | Status: DC

## 2019-03-09 MED ORDER — BENZONATATE 100 MG PO CAPS: 100.0000 mg | ORAL_CAPSULE | Freq: Three times a day (TID) | ORAL | 0 refills | Status: DC | PRN

## 2019-03-09 MED ORDER — LISINOPRIL-HYDROCHLOROTHIAZIDE 10-12.5 MG PO TABS: 2.0000 | ORAL_TABLET | Freq: Every day | ORAL | 2 refills | Status: DC

## 2019-03-09 MED ORDER — CARVEDILOL 12.5 MG PO TABS: 12.5000 mg | ORAL_TABLET | Freq: Two times a day (BID) | ORAL | 3 refills | Status: DC

## 2019-03-09 NOTE — Patient Instructions (Addendum)
DASH Eating Plan DASH stands for "Dietary Approaches to Stop Hypertension." The DASH eating plan is a healthy eating plan that has been shown to reduce high blood pressure (hypertension). It may also reduce your risk for type 2 diabetes, heart disease, and stroke. The DASH eating plan may also help with weight loss. What are tips for following this plan?  General guidelines  Avoid eating more than 2,300 mg (milligrams) of salt (sodium) a day. If you have hypertension, you may need to reduce your sodium intake to 1,500 mg a day.  Limit alcohol intake to no more than 1 drink a day for nonpregnant women and 2 drinks a day for men. One drink equals 12 oz of beer, 5 oz of wine, or 1 oz of hard liquor.  Work with your health care provider to maintain a healthy body weight or to lose weight. Ask what an ideal weight is for you.  Get at least 30 minutes of exercise that causes your heart to beat faster (aerobic exercise) most days of the week. Activities may include walking, swimming, or biking.  Work with your health care provider or diet and nutrition specialist (dietitian) to adjust your eating plan to your individual calorie needs. Reading food labels   Check food labels for the amount of sodium per serving. Choose foods with less than 5 percent of the Daily Value of sodium. Generally, foods with less than 300 mg of sodium per serving fit into this eating plan.  To find whole grains, look for the word "whole" as the first word in the ingredient list. Shopping  Buy products labeled as "low-sodium" or "no salt added."  Buy fresh foods. Avoid canned foods and premade or frozen meals. Cooking  Avoid adding salt when cooking. Use salt-free seasonings or herbs instead of table salt or sea salt. Check with your health care provider or pharmacist before using salt substitutes.  Do not fry foods. Cook foods using healthy methods such as baking, boiling, grilling, and broiling instead.  Cook  with heart-healthy oils, such as olive, canola, soybean, or sunflower oil. Meal planning  Eat a balanced diet that includes: ? 5 or more servings of fruits and vegetables each day. At each meal, try to fill half of your plate with fruits and vegetables. ? Up to 6-8 servings of whole grains each day. ? Less than 6 oz of lean meat, poultry, or fish each day. A 3-oz serving of meat is about the same size as a deck of cards. One egg equals 1 oz. ? 2 servings of low-fat dairy each day. ? A serving of nuts, seeds, or beans 5 times each week. ? Heart-healthy fats. Healthy fats called Omega-3 fatty acids are found in foods such as flaxseeds and coldwater fish, like sardines, salmon, and mackerel.  Limit how much you eat of the following: ? Canned or prepackaged foods. ? Food that is high in trans fat, such as fried foods. ? Food that is high in saturated fat, such as fatty meat. ? Sweets, desserts, sugary drinks, and other foods with added sugar. ? Full-fat dairy products.  Do not salt foods before eating.  Try to eat at least 2 vegetarian meals each week.  Eat more home-cooked food and less restaurant, buffet, and fast food.  When eating at a restaurant, ask that your food be prepared with less salt or no salt, if possible. What foods are recommended? The items listed may not be a complete list. Talk with your dietitian  about what dietary choices are best for you. Grains Whole-grain or whole-wheat bread. Whole-grain or whole-wheat pasta. Brown rice. [place]. Bulgur. Whole-grain and low-sodium cereals. Pita bread. Low-fat, low-sodium crackers. Whole-wheat flour tortillas. Vegetables Fresh or frozen vegetables (raw, steamed, roasted, or grilled). Low-sodium or reduced-sodium tomato and vegetable juice. Low-sodium or reduced-sodium tomato sauce and tomato paste. Low-sodium or reduced-sodium canned vegetables. Fruits All fresh, dried, or frozen fruit. Canned fruit in natural juice  (without added sugar). Meat and other protein foods Skinless chicken or [place]. Ground chicken or [place]. Pork with fat trimmed off. Fish and seafood. Egg whites. Dried beans, peas, or lentils. Unsalted nuts, nut butters, and seeds. Unsalted canned beans. Lean cuts of beef with fat trimmed off. Low-sodium, lean deli meat. Dairy Low-fat (1%) or fat-free (skim) milk. Fat-free, low-fat, or reduced-fat cheeses. Nonfat, low-sodium ricotta or cottage cheese. Low-fat or nonfat yogurt. Low-fat, low-sodium cheese. Fats and oils Soft margarine without trans fats. Vegetable oil. Low-fat, reduced-fat, or light mayonnaise and salad dressings (reduced-sodium). Canola, safflower, olive, soybean, and sunflower oils. Avocado. Seasoning and other foods Herbs. Spices. Seasoning mixes without salt. Unsalted popcorn and pretzels. Fat-free sweets. What foods are not recommended? The items listed may not be a complete list. Talk with your dietitian about what dietary choices are best for you. Grains Baked goods made with fat, such as croissants, muffins, or some breads. Dry pasta or rice meal packs. Vegetables Creamed or fried vegetables. Vegetables in a cheese sauce. Regular canned vegetables (not low-sodium or reduced-sodium). Regular canned tomato sauce and paste (not low-sodium or reduced-sodium). Regular tomato and vegetable juice (not low-sodium or reduced-sodium). Angie Fava. Olives. Fruits Canned fruit in a light or heavy syrup. Fried fruit. Fruit in cream or butter sauce. Meat and other protein foods Fatty cuts of meat. Ribs. Fried meat. Berniece Salines. Sausage. Bologna and other processed lunch meats. Salami. Fatback. Hotdogs. Bratwurst. Salted nuts and seeds. Canned beans with added salt. Canned or smoked fish. Whole eggs or egg yolks. Chicken or [place] with skin. Dairy Whole or 2% milk, cream, and half-and-half. Whole or full-fat cream cheese. Whole-fat or sweetened yogurt. Full-fat cheese. Nondairy creamers. Whipped  toppings. Processed cheese and cheese spreads. Fats and oils Butter. Stick margarine. Lard. Shortening. Ghee. Bacon fat. Tropical oils, such as coconut, palm kernel, or palm oil. Seasoning and other foods Salted popcorn and pretzels. Onion salt, garlic salt, seasoned salt, table salt, and sea salt. Worcestershire sauce. Tartar sauce. Barbecue sauce. Teriyaki sauce. Soy sauce, including reduced-sodium. Steak sauce. Canned and packaged gravies. Fish sauce. Oyster sauce. Cocktail sauce. Horseradish that you find on the shelf. Ketchup. Mustard. Meat flavorings and tenderizers. Bouillon cubes. Hot sauce and Tabasco sauce. Premade or packaged marinades. Premade or packaged taco seasonings. Relishes. Regular salad dressings. Where to find more information:  National Heart, Lung, and [place]: https://wilson-eaton.com/  American Heart Association: www.heart.org Summary  The DASH eating plan is a healthy eating plan that has been shown to reduce high blood pressure (hypertension). It may also reduce your risk for type 2 diabetes, heart disease, and stroke.  With the DASH eating plan, you should limit salt (sodium) intake to 2,300 mg a day. If you have hypertension, you may need to reduce your sodium intake to 1,500 mg a day.  When on the DASH eating plan, aim to eat more fresh fruits and vegetables, whole grains, lean proteins, low-fat dairy, and heart-healthy fats.  Work with your health care provider or diet and nutrition specialist (dietitian) to adjust your eating plan to  your individual calorie needs. This information is not intended to replace advice given to you by your health care provider. Make sure you discuss any questions you have with your health care provider. Document Released: 11/18/2011 Document Revised: 11/22/2016 Document Reviewed: 11/22/2016 Elsevier Interactive Patient Education  2019 [employer].   Hypertension Hypertension, commonly called high blood pressure, is when the  force of blood pumping through the arteries is too strong. The arteries are the blood vessels that carry blood from the heart throughout the body. Hypertension forces the heart to work harder to pump blood and may cause arteries to become narrow or stiff. Having untreated or uncontrolled hypertension can cause heart attacks, strokes, kidney disease, and other problems. A blood pressure reading consists of a higher number over a lower number. Ideally, your blood pressure should be below 120/80. The first ("top") number is called the systolic pressure. It is a measure of the pressure in your arteries as your heart beats. The second ("bottom") number is called the diastolic pressure. It is a measure of the pressure in your arteries as the heart relaxes. What are the causes? The cause of this condition is not known. What increases the risk? Some risk factors for high blood pressure are under your control. Others are not. Factors you can change  Smoking.  Having type 2 diabetes mellitus, high cholesterol, or both.  Not getting enough exercise or physical activity.  Being overweight.  Having too much fat, sugar, calories, or salt (sodium) in your diet.  Drinking too much alcohol. Factors that are difficult or impossible to change  Having chronic kidney disease.  Having a family history of high blood pressure.  Age. Risk increases with age.  Race. You may be at higher risk if you are African-American.  Gender. Men are at higher risk than women before age 86. After age 52, women are at higher risk than men.  Having obstructive sleep apnea.  Stress. What are the signs or symptoms? Extremely high blood pressure (hypertensive crisis) may cause:  Headache.  Anxiety.  Shortness of breath.  Nosebleed.  Nausea and vomiting.  Severe chest pain.  Jerky movements you cannot control (seizures). How is this diagnosed? This condition is diagnosed by measuring your blood pressure while  you are seated, with your arm resting on a surface. The cuff of the blood pressure monitor will be placed directly against the skin of your upper arm at the level of your heart. It should be measured at least twice using the same arm. Certain conditions can cause a difference in blood pressure between your right and left arms. Certain factors can cause blood pressure readings to be lower or higher than normal (elevated) for a short period of time:  When your blood pressure is higher when you are in a health care provider's office than when you are at home, this is called white coat hypertension. Most people with this condition do not need medicines.  When your blood pressure is higher at home than when you are in a health care provider's office, this is called masked hypertension. Most people with this condition may need medicines to control blood pressure. If you have a high blood pressure reading during one visit or you have normal blood pressure with other risk factors:  You may be asked to return on a different day to have your blood pressure checked again.  You may be asked to monitor your blood pressure at home for 1 week or longer. If you are  diagnosed with hypertension, you may have other blood or imaging tests to help your health care provider understand your overall risk for other conditions. How is this treated? This condition is treated by making healthy lifestyle changes, such as eating healthy foods, exercising more, and reducing your alcohol intake. Your health care provider may prescribe medicine if lifestyle changes are not enough to get your blood pressure under control, and if:  Your systolic blood pressure is above 130.  Your diastolic blood pressure is above 80. Your personal target blood pressure may vary depending on your medical conditions, your age, and other factors. Follow these instructions at home: Eating and drinking   Eat a diet that is high in fiber and  potassium, and low in sodium, added sugar, and fat. An example eating plan is called the DASH (Dietary Approaches to Stop Hypertension) diet. To eat this way: ? Eat plenty of fresh fruits and vegetables. Try to fill half of your plate at each meal with fruits and vegetables. ? Eat whole grains, such as whole wheat pasta, brown rice, or whole grain bread. Fill about one quarter of your plate with whole grains. ? Eat or drink low-fat dairy products, such as skim milk or low-fat yogurt. ? Avoid fatty cuts of meat, processed or cured meats, and poultry with skin. Fill about one quarter of your plate with lean proteins, such as fish, chicken without skin, beans, eggs, and tofu. ? Avoid premade and processed foods. These tend to be higher in sodium, added sugar, and fat.  Reduce your daily sodium intake. Most people with hypertension should eat less than 1,500 mg of sodium a day.  Limit alcohol intake to no more than 1 drink a day for nonpregnant women and 2 drinks a day for men. One drink equals 12 oz of beer, 5 oz of wine, or 1 oz of hard liquor. Lifestyle   Work with your health care provider to maintain a healthy body weight or to lose weight. Ask what an ideal weight is for you.  Get at least 30 minutes of exercise that causes your heart to beat faster (aerobic exercise) most days of the week. Activities may include walking, swimming, or biking.  Include exercise to strengthen your muscles (resistance exercise), such as pilates or lifting weights, as part of your weekly exercise routine. Try to do these types of exercises for 30 minutes at least 3 days a week.  Do not use any products that contain nicotine or tobacco, such as cigarettes and e-cigarettes. If you need help quitting, ask your health care provider.  Monitor your blood pressure at home as told by your health care provider.  Keep all follow-up visits as told by your health care provider. This is important. Medicines  Take  over-the-counter and prescription medicines only as told by your health care provider. Follow directions carefully. Blood pressure medicines must be taken as prescribed.  Do not skip doses of blood pressure medicine. Doing this puts you at risk for problems and can make the medicine less effective.  Ask your health care provider about side effects or reactions to medicines that you should watch for. Contact a health care provider if:  You think you are having a reaction to a medicine you are taking.  You have headaches that keep coming back (recurring).  You feel dizzy.  You have swelling in your ankles.  You have trouble with your vision. Get help right away if:  You develop a severe headache or confusion.  You have unusual weakness or numbness.  You feel faint.  You have severe pain in your chest or abdomen.  You vomit repeatedly.  You have trouble breathing. Summary  Hypertension is when the force of blood pumping through your arteries is too strong. If this condition is not controlled, it may put you at risk for serious complications.  Your personal target blood pressure may vary depending on your medical conditions, your age, and other factors. For most people, a normal blood pressure is less than 120/80.  Hypertension is treated with lifestyle changes, medicines, or a combination of both. Lifestyle changes include weight loss, eating a healthy, low-sodium diet, exercising more, and limiting alcohol. This information is not intended to replace advice given to you by your health care provider. Make sure you discuss any questions you have with your health care provider. Document Released: 11/29/2005 Document Revised: 10/27/2016 Document Reviewed: 10/27/2016 Elsevier Interactive Patient Education  2019 [employer] Inc[employer]   Preventing Type 2 Diabetes Mellitus Type 2 diabetes (type 2 diabetes mellitus) is a long-term (chronic) disease that affects blood sugar (glucose) levels.  Normally, a hormone called insulin allows glucose to enter cells in the body. The cells use glucose for energy. In type 2 diabetes, one or both of these problems may be present:  The body does not make enough insulin.  The body does not respond properly to insulin that it makes (insulin resistance). Insulin resistance or lack of insulin causes excess glucose to build up in the blood instead of going into cells. As a result, high blood glucose (hyperglycemia) develops, which can cause many complications. Being overweight or obese and having an inactive (sedentary) lifestyle can increase your risk for diabetes. Type 2 diabetes can be delayed or prevented by making certain nutrition and lifestyle changes. What nutrition changes can be made?   Eat healthy meals and snacks regularly. Keep a healthy snack with you for when you get hungry between meals, such as fruit or a handful of nuts.  Eat lean meats and proteins that are low in saturated fats, such as chicken, fish, egg whites, and beans. Avoid processed meats.  Eat plenty of fruits and vegetables and plenty of grains that have not been processed (whole grains). It is recommended that you eat: ? 1?2 cups of fruit every day. ? 2?3 cups of vegetables every day. ? 6?8 oz of whole grains every day, such as oats, whole wheat, bulgur, brown rice, quinoa, and millet.  Eat low-fat dairy products, such as milk, yogurt, and cheese.  Eat foods that contain healthy fats, such as nuts, avocado, olive oil, and canola oil.  Drink water throughout the day. Avoid drinks that contain added sugar, such as soda or sweet tea.  Follow instructions from your health care provider about specific eating or drinking restrictions.  Control how much food you eat at a time (portion size). ? Check food labels to find out the serving sizes of foods. ? Use a kitchen scale to weigh amounts of foods.  Saute or steam food instead of frying it. Cook with water or broth  instead of oils or butter.  Limit your intake of: ? Salt (sodium). Have no more than 1 tsp (2,400 mg) of sodium a day. If you have heart disease or high blood pressure, have less than ? tsp (1,500 mg) of sodium a day. ? Saturated fat. This is fat that is solid at room temperature, such as butter or fat on meat. What lifestyle changes can be made?  Activity   Do moderate-intensity physical activity for at least 30 minutes on at least 5 days of the week, or as much as told by your health care provider.  Ask your health care provider what activities are safe for you. A mix of physical activities may be best, such as walking, swimming, cycling, and strength training.  Try to add physical activity into your day. For example: ? Park in spots that are farther away than usual, so that you walk more. For example, park in a far corner of the parking lot when you go to the office or the grocery store. ? Take a walk during your lunch break. ? Use stairs instead of elevators or escalators. Weight Loss  Lose weight as directed. Your health care provider can determine how much weight loss is best for you and can help you lose weight safely.  If you are overweight or obese, you may be instructed to lose at least 5?7 % of your body weight. Alcohol and Tobacco   Limit alcohol intake to no more than 1 drink a day for nonpregnant women and 2 drinks a day for men. One drink equals 12 oz of beer, 5 oz of wine, or 1 oz of hard liquor.  Do not use any tobacco products, such as cigarettes, chewing tobacco, and e-cigarettes. If you need help quitting, ask your health care provider. Work With [place] Provider  Have your blood glucose tested regularly, as told by your health care provider.  Discuss your risk factors and how you can reduce your risk for diabetes.  Get screening tests as told by your health care provider. You may have screening tests regularly, especially if you have certain risk  factors for type 2 diabetes.  Make an appointment with a diet and nutrition specialist (registered dietitian). A registered dietitian can help you make a healthy eating plan and can help you understand portion sizes and food labels. Why are these changes important?  It is possible to prevent or delay type 2 diabetes and related health problems by making lifestyle and nutrition changes.  It can be difficult to recognize signs of type 2 diabetes. The best way to avoid possible damage to your body is to take actions to prevent the disease before you develop symptoms. What can happen if changes are not made?  Your blood glucose levels may keep increasing. Having high blood glucose for a long time is dangerous. Too much glucose in your blood can damage your blood vessels, heart, kidneys, nerves, and eyes.  You may develop prediabetes or type 2 diabetes. Type 2 diabetes can lead to many chronic health problems and complications, such as: ? Heart disease. ? Stroke. ? Blindness. ? Kidney disease. ? Depression. ? Poor circulation in the feet and legs, which could lead to surgical removal (amputation) in severe cases. Where to find support  Ask your health care provider to recommend a registered dietitian, diabetes educator, or weight loss program.  Look for local or online weight loss groups.  Join a gym, fitness club, or outdoor activity group, such as a walking club. Where to find more information To learn more about diabetes and diabetes prevention, visit:  American Diabetes Association (ADA): www.diabetes.[employer] of Diabetes and Digestive and Kidney Diseases: FindSpin.nl To learn more about healthy eating, visit:  The U.S. Department of Agriculture Scientist, research (physical sciences)), Choose My Plate: http://wiley-williams.com/  Office of Disease Prevention and Health Promotion (ODPHP), Dietary Guidelines: SurferLive.at Summary  You can  reduce  your risk for type 2 diabetes by increasing your physical activity, eating healthy foods, and losing weight as directed.  Talk with your health care provider about your risk for type 2 diabetes. Ask about any blood tests or screening tests that you need to have. This information is not intended to replace advice given to you by your health care provider. Make sure you discuss any questions you have with your health care provider. Document Released: 03/22/2016 Document Revised: 11/10/2017 Document Reviewed: 01/20/2016 Elsevier Interactive Patient Education  2019 [employer].

## 2019-03-09 NOTE — Telephone Encounter (Signed)
Patient would like a letter for his food stamps, stating that he would benefit from having them again. Patient is aware that it will be completed next week and will be mailed to his home address.

## 2019-03-09 NOTE — Progress Notes (Signed)
Patient ID: Nathan Bradford, male    DOB: 17-Jan-1991, 28 y.o.   MRN: [id number]  PCP: Scot Jun, FNP  Chief Complaint  Patient presents with  . Hypertension    Subjective:  HPI Nathan Bradford is a 28 y.o. male presents for evaluation for hypertension follow-up.  Medical history includes prediabetes, GAD, hypertension, and morbid obesity. Patient doesn't check his blood pressure at home. Since his last visit with me, he has followed-up with cardiology and had an unremarkable echocardiogram. He has not experienced any further chest pain, shortness of breath, weakness, or edema. He has not loss weight although reports he is exercising by walking. Current Body mass index is 44.08 kg/m. Endorses adherence to medication. He will have a repeat A1C and fasting lipid panel.  Allergies Endorses worsening congestion and cough over the last few weeks. He has taken over the counter cough suppressants without improvement of symptoms. Unknown if ever formally diagnosed with allergies. He denies SOB, chest tightness, productive cough, or wheezing. Social History   Socioeconomic History  . Marital status: Single    Spouse name: Not on file  . Number of children: Not on file  . Years of education: Not on file  . Highest education level: Not on file  Occupational History  . Not on file  Social Needs  . Financial resource strain: Not on file  . Food insecurity:    Worry: Not on file    Inability: Not on file  . Transportation needs:    Medical: Not on file    Non-medical: Not on file  Tobacco Use  . Smoking status: Never Smoker  . Smokeless tobacco: Never Used  Substance and Sexual Activity  . Alcohol use: No  . Drug use: No  . Sexual activity: Not on file  Lifestyle  . Physical activity:    Days per week: Not on file    Minutes per session: Not on file  . Stress: Not on file  Relationships  . Social connections:    Talks on phone: Not on file    Gets together: Not on file     Attends religious service: Not on file    Active member of club or organization: Not on file    Attends meetings of clubs or organizations: Not on file    Relationship status: Not on file  . Intimate partner violence:    Fear of current or ex partner: Not on file    Emotionally abused: Not on file    Physically abused: Not on file    Forced sexual activity: Not on file  Other Topics Concern  . Not on file  Social History Narrative  . Not on file    Family History  Problem Relation Age of Onset  . Hypertension Mother   . Hypertension Father    Review of Systems Pertinent negatives listed in HPI Patient Active Problem List   Diagnosis Date Noted  . Chest discomfort 01/19/2019  . Diabetes mellitus due to underlying condition with unspecified complications ([place]) 76/22/6333  . Nonspecific ST-T wave electrocardiographic changes 12/30/2018  . Essential hypertension 12/30/2018  . BMI 40.0-44.9, adult ([place]) 12/30/2018  . Generalized anxiety disorder 04/06/2015  . Panic disorder 04/06/2015    No Known Allergies  Prior to Admission medications   Medication Sig Start Date End Date Taking? Authorizing Provider  atorvastatin (LIPITOR) 20 MG tablet Take 1 tablet (20 mg total) by mouth daily. 01/04/19  Yes Scot Jun, FNP  carvedilol (COREG) 12.5 MG  tablet Take 1 tablet (12.5 mg total) by mouth 2 (two) times daily with a meal. 01/19/19  Yes Revankar, Reita Cliche, MD  FLUoxetine (PROZAC) 20 MG capsule Take 20 mg by mouth daily.   Yes [provider]  hydrOXYzine (ATARAX/VISTARIL) 25 MG tablet Take 25 mg by mouth 3 (three) times daily.   Yes [provider]  lisinopril-hydrochlorothiazide (PRINZIDE,ZESTORETIC) 10-12.5 MG tablet Take 2 tablets by mouth once daily 03/08/19  Yes Scot Jun, FNP  metFORMIN (GLUCOPHAGE) 500 MG tablet Take 1 tablet (500 mg total) by mouth daily with breakfast. 01/04/19  Yes Scot Jun, FNP  traZODone (DESYREL) 50 MG tablet Take 50  mg by mouth at bedtime.   Yes [provider]    Past Medical, Surgical Family and Social History reviewed and updated.    Objective:   Today's Vitals   03/09/19 0858  BP: (!) 138/93  Pulse: 81  Resp: 17  Temp: 98.2 F (36.8 C)  TempSrc: Oral  SpO2: 96%  Weight: (!) 311 lb 9.6 oz (141.3 kg)  Height: 5' 10.5" (1.791 m)    Wt Readings from Last 3 Encounters:  03/09/19 (!) 311 lb 9.6 oz (141.3 kg)  02/06/19 (!) 305 lb (138.3 kg)  02/05/19 (!) 303 lb 12.8 oz (137.8 kg)     Physical Exam General appearance: alert, well developed, well nourished, cooperative and in no distress Head: Normocephalic, without obvious abnormality, atraumatic Respiratory: Respirations even and unlabored, normal respiratory rate Heart: rate and rhythm normal. No gallop or murmurs noted on exam  Extremities: No gross deformities Skin: Skin color, texture, turgor normal. No rashes seen  Psych: Appropriate mood and affect. Neurologic: Mental status: Alert, oriented to person, place, and time, thought content appropriate.  No results found for: POCGLU  Lab Results  Component Value Date   HGBA1C 5.9 (H) 01/01/2019            Assessment & Plan:  1. Essential hypertension, controlled We have discussed target BP range and blood pressure goal. I have advised patient to check BP regularly and to call us back or report to clinic if the numbers are consistently higher than 140/90. We discussed the importance of compliance with medical therapy and DASH diet recommended, consequences of uncontrolled hypertension discussed.  - continue current BP medications   2. BMI 40.0-44.9, adult ([place])  I recommend lifestyle changes such as engaging in routine physical activity with a goal of 150 minutes per week, increasing intake of vegetables, fruits, fiber, and selecting lean cuts of meat.    3. Prediabetes Continue metformin. Last A1C 5.9.  4. Seasonal allergies -Cough take benzonatate as  needed -Start daily antihistamine therapy with loratadine    5. Hyperlipidemia, unspecified hyperlipidemia type -continue atorvastatin -recheck a fasting lipid panel      -The patient was given clear instructions to go to ER or return to medical center if symptoms do not improve, worsen or new problems develop. The patient verbalized understanding.    Molli Barrows, FNP Primary Care at [facility] [address], [place] [phone]: [phone]

## 2019-03-10 LAB — LIPID PANEL
Chol/HDL Ratio: 3.8 ratio (ref 0.0–5.0)
Cholesterol, Total: 129 mg/dL (ref 100–199)
HDL: 34 mg/dL — ABNORMAL LOW (ref >39)
LDL CALC: 73 mg/dL (ref 0–99)
Triglycerides: 112 mg/dL (ref 0–149)
VLDL Cholesterol Cal: 22 mg/dL (ref 5–40)

## 2019-03-10 LAB — HEMOGLOBIN A1C
Est. average glucose Bld gHb Est-mCnc: 120 mg/dL
HEMOGLOBIN A1C: 5.8 % — AB (ref 4.8–5.6)

## 2019-03-12 NOTE — Telephone Encounter (Signed)
Letter mailed

## 2019-03-12 NOTE — Telephone Encounter (Signed)
Please mail letter to patient  

## 2019-03-13 NOTE — Progress Notes (Signed)
Patient notified of results & recommendations. Expressed understanding.

## 2019-04-02 ENCOUNTER — Ambulatory Visit (INDEPENDENT_AMBULATORY_CARE_PROVIDER_SITE_OTHER): Payer: Self-pay | Admitting: Family Medicine

## 2019-04-02 ENCOUNTER — Other Ambulatory Visit: Payer: Self-pay

## 2019-04-02 ENCOUNTER — Telehealth: Payer: Self-pay | Admitting: Family Medicine

## 2019-04-02 DIAGNOSIS — J989 Respiratory disorder, unspecified: Secondary | ICD-10-CM

## 2019-04-02 DIAGNOSIS — R0989 Other specified symptoms and signs involving the circulatory and respiratory systems: Secondary | ICD-10-CM

## 2019-04-02 DIAGNOSIS — J302 Other seasonal allergic rhinitis: Secondary | ICD-10-CM

## 2019-04-02 MED ORDER — ALBUTEROL SULFATE HFA 108 (90 BASE) MCG/ACT IN AERS: 2.0000 | INHALATION_SPRAY | Freq: Four times a day (QID) | RESPIRATORY_TRACT | 1 refills | Status: DC | PRN

## 2019-04-02 MED ORDER — BENZONATATE 100 MG PO CAPS: 100.0000 mg | ORAL_CAPSULE | Freq: Three times a day (TID) | ORAL | 1 refills | Status: DC | PRN

## 2019-04-02 MED FILL — !VENTOLIN HFA INHALER: 108 (90 BAS | 25 days supply | Qty: 18 | Fill #0

## 2019-04-02 NOTE — Telephone Encounter (Signed)
Please add patient on schedule at 3:50 if he's available for a telehealth visit to further evaluate cough.

## 2019-04-02 NOTE — Telephone Encounter (Signed)
Patient called stating that the medication for the cough isn't work would like something else please advise

## 2019-04-02 NOTE — Progress Notes (Deleted)
Called patient to initiate their telephone visit with provider Molli Barrows, FNP-C . Verified date of birth. Patient states that his cough went away the first couple days of taking the [facility] but then came back. Cough is now productive. Denies itchy watery eyes, runny nose, nasal congestion, fever. KWalker, CMA.

## 2019-04-02 NOTE — Telephone Encounter (Signed)
Call the patient back lvm for him to call back

## 2019-04-02 NOTE — Progress Notes (Signed)
Virtual Visit via Telephone Note  I connected with Nathan Bradford on 04/02/19 at  3:50 PM EDT by telephone and verified that I am speaking with the correct person using two identifiers.   I discussed the limitations, risks, security and privacy concerns of performing an evaluation and management service by telephone and the availability of in person appointments. I also discussed with the patient that there may be a patient responsible charge related to this service. The patient expressed understanding and agreed to proceed.  Provider is located at primary care office.   History of Present Illness: Persistent Recurrent Cough Patient was seen in office on 03/09/19 in which he complained of allergy symptoms and cough. He was prescribed Loratadine and benzonatate and reports cough completely resolved while taking medication. Reports cough returned once benzonatate was completed. The cough now is occasionally productive of clear mucus and he endorses intermittent wheezing. No known history of asthma. He is unaware of what causes cough to occur. He doesn't feel congested nor does he endorse nasal drainage. He is continuing to take Loratadine daily as prescribed. Endorses SOB with persistent coughing. He is not experiencing chest tight or heavy pressure. Denies associated symptoms of fever, body aches, nasal congestion, nasal drainage, headache, or sore throat. No history of smoking or exposure to smoke.  Assessment and Plan: 1. Seasonal allergies 2. Reactive airway disease that is not asthma Suspect symptoms related to possible reactive airway irritation secondary to seasonal allergies. Given new symptom of wheezing, will trial the following plan of treatment: -Continue loratadine and refilled benzonatate  -Trial Albuterol inhaler 2 puffs every 4-6 hours as needed -If cough persists, may consider discontinuing Lisinopril-HCTZ to determine if medication is an irritant.  During conversation patient never  coughed, able to speak in complete symptoms, and no audible evidence of respiratory distress.      Follow Up Instructions: Keep schedule follow-up   I discussed the assessment and treatment plan with the patient. The patient was provided an opportunity to ask questions and all were answered. The patient agreed with the plan and demonstrated an understanding of the instructions.   The patient was advised to call back or seek an in-person evaluation if the symptoms worsen or if the condition fails to improve as anticipated.  I provided 15  minutes of non-face-to-face time during this encounter.   Molli Barrows, FNP

## 2019-04-09 ENCOUNTER — Telehealth: Payer: Self-pay | Admitting: Family Medicine

## 2019-04-09 NOTE — Telephone Encounter (Signed)
Pt aware of the message per PCP- Mrs. Harris.  Repeated phone number back to [facility].

## 2019-04-09 NOTE — Telephone Encounter (Signed)
If he is having symptoms of acute vision changes, I would recommend follow-up at the ER. If visual acuity has been a chronic issues, he will need to obtain a routine vision exam. I would recommend calling [facility] [phone]. He will be required to pay out of pocket given he is without insurance. Patient is prediabetic and therefore metformin is preventative to reduce patient's risk of developing T2DM, therefore he doesn't have a meter.

## 2019-04-09 NOTE — Telephone Encounter (Signed)
Has noticed that vision is getting worse and grandmother told him he needed to see the eye doctor or call PCP to have vision evaluated.He states he does not have an eye doctor.   Denies discharge or drainage from eyes.  Has some discomfort at the top of his eyelashes. This has been going on for some time.    Patient has not been taking blood sugars. Does not have a meter to judge readings.

## 2019-04-09 NOTE — Telephone Encounter (Signed)
Patient called requesting to speak to nurse, patient states his eyes are continuously getting blurry, please follow up.

## 2019-04-23 ENCOUNTER — Telehealth: Payer: Self-pay | Admitting: Family Medicine

## 2019-04-23 NOTE — Telephone Encounter (Signed)
Patient would like a call back from nurse, patient has concerns about his on going cough, please follow up.

## 2019-04-23 NOTE — Telephone Encounter (Signed)
Left voice mail to call back 

## 2019-04-24 ENCOUNTER — Ambulatory Visit (HOSPITAL_COMMUNITY)
Admission: EM | Admit: 2019-04-24 | Discharge: 2019-04-24 | Disposition: A | Discharge status: Home / Self Care | Payer: Self-pay | Attending: Family Medicine | Admitting: Family Medicine

## 2019-04-24 ENCOUNTER — Other Ambulatory Visit: Payer: Self-pay

## 2019-04-24 ENCOUNTER — Ambulatory Visit (INDEPENDENT_AMBULATORY_CARE_PROVIDER_SITE_OTHER): Payer: Self-pay

## 2019-04-24 ENCOUNTER — Encounter (HOSPITAL_COMMUNITY): Payer: Self-pay

## 2019-04-24 DIAGNOSIS — H5789 Other specified disorders of eye and adnexa: Secondary | ICD-10-CM

## 2019-04-24 DIAGNOSIS — R05 Cough: Secondary | ICD-10-CM

## 2019-04-24 DIAGNOSIS — R067 Sneezing: Secondary | ICD-10-CM

## 2019-04-24 DIAGNOSIS — Z889 Allergy status to unspecified drugs, medicaments and biological substances status: Secondary | ICD-10-CM

## 2019-04-24 NOTE — ED Provider Notes (Signed)
[facility]    CSN: [id number] Arrival date & time: 04/24/19  1331     History   Chief Complaint Chief Complaint  Patient presents with  . Cough  . Emesis    HPI Nathan Bradford is a 28 y.o. male.   Pt is a 28 year old male that presents with intermittent cough over the past 2 month. Over the last few days the cough has become worse and more deep. He is currently being treated for allergies. He is taking Claritin and tessalon for cough. He also has an albuterol inhaler to use as needed. He has some associated sneezing, PND and itchy watery eyes. Was sent here for chest x ray to r/o PNA. He denies any hx of asthma, chest pain, shortness of breath.  Denies any recent sick contacts or recent trauma.  Denies any history of PE or DVT.  Denies any lower extremity edema or calf pain.  He does not smoke  ROS per HPI     Past Medical History:  Diagnosis Date  . Anxiety   . Bipolar 1 disorder ([place])   . Depression   . Vertigo     Patient Active Problem List   Diagnosis Date Noted  . Chest discomfort 01/19/2019  . Diabetes mellitus due to underlying condition with unspecified complications ([place]) 79/89/2119  . Nonspecific ST-T wave electrocardiographic changes 12/30/2018  . Essential hypertension 12/30/2018  . BMI 40.0-44.9, adult ([place]) 12/30/2018  . Generalized anxiety disorder 04/06/2015  . Panic disorder 04/06/2015    Past Surgical History:  Procedure Laterality Date  . WISDOM TOOTH EXTRACTION         Home Medications    Prior to Admission medications   Medication Sig Start Date End Date Taking? Authorizing Provider  albuterol (VENTOLIN HFA) 108 (90 Base) MCG/ACT inhaler Inhale 2 puffs into the lungs every 6 (six) hours as needed for wheezing or shortness of breath. 04/02/19   Scot Jun, FNP  atorvastatin (LIPITOR) 20 MG tablet Take 1 tablet (20 mg total) by mouth daily. 01/04/19   Scot Jun, FNP  benzonatate (TESSALON) 100 MG capsule Take 1-2  capsules (100-200 mg total) by mouth 3 (three) times daily as needed for cough. 04/02/19   Scot Jun, FNP  carvedilol (COREG) 12.5 MG tablet Take 1 tablet (12.5 mg total) by mouth 2 (two) times daily with a meal. 03/09/19   Scot Jun, FNP  FLUoxetine (PROZAC) 20 MG capsule Take 20 mg by mouth daily.    [provider]  hydrOXYzine (ATARAX/VISTARIL) 25 MG tablet Take 25 mg by mouth 3 (three) times daily.    [provider]  lisinopril-hydrochlorothiazide (PRINZIDE,ZESTORETIC) 10-12.5 MG tablet Take 2 tablets by mouth daily. 03/09/19   Scot Jun, FNP  loratadine (CLARITIN) 10 MG tablet Take 1 tablet (10 mg total) by mouth daily. 03/09/19   Scot Jun, FNP  metFORMIN (GLUCOPHAGE) 500 MG tablet Take 1 tablet (500 mg total) by mouth daily with breakfast. 01/04/19   Scot Jun, FNP  traZODone (DESYREL) 50 MG tablet Take 50 mg by mouth at bedtime.    [provider]    Family History Family History  Problem Relation Age of Onset  . Hypertension Mother   . Hypertension Father     Social History Social History   Tobacco Use  . Smoking status: Never Smoker  . Smokeless tobacco: Never Used  Substance Use Topics  . Alcohol use: No  . Drug use: No  Allergies   Patient has no known allergies.   Review of Systems Review of Systems   Physical Exam Triage Vital Signs ED Triage Vitals  Enc Vitals Group     BP 04/24/19 1357 (!) 166/100     Pulse Rate 04/24/19 1357 77     Resp 04/24/19 1357 18     Temp 04/24/19 1357 98.3 F (36.8 C)     Temp Source 04/24/19 1357 Oral     SpO2 04/24/19 1357 98 %     Weight 04/24/19 1355 (!) 309 lb (140.2 kg)     Height 04/24/19 1355 5\' 11"  (1.803 m)     Head Circumference --      Peak Flow --      Pain Score 04/24/19 1355 1     Pain Loc --      Pain Edu? --      Excl. in [place]? --    No data found.  Updated Vital Signs BP (!) 166/100 (BP Location: Left Arm)   Pulse 77   Temp  98.3 F (36.8 C) (Oral)   Resp 18   Ht 5\' 11"  (1.803 m)   Wt (!) 309 lb (140.2 kg)   SpO2 98%   BMI 43.10 kg/m   Visual Acuity Right Eye Distance:   Left Eye Distance:   Bilateral Distance:    Right Eye Near:   Left Eye Near:    Bilateral Near:     Physical Exam Vitals signs and nursing note reviewed.  Constitutional:      General: He is not in acute distress.    Appearance: Normal appearance. He is not ill-appearing, toxic-appearing or diaphoretic.  HENT:     Head: Normocephalic and atraumatic.     Right Ear: Tympanic membrane and ear canal normal.     Left Ear: Tympanic membrane and ear canal normal.     Nose: Nose normal.     Mouth/Throat:     Pharynx: Oropharynx is clear.  Eyes:     Conjunctiva/sclera: Conjunctivae normal.  Neck:     Musculoskeletal: Normal range of motion.  Cardiovascular:     Rate and Rhythm: Normal rate and regular rhythm.     Pulses: Normal pulses.     Heart sounds: Normal heart sounds.  Pulmonary:     Effort: Pulmonary effort is normal.     Comments: Mild expiratory wheezing in the right upper  Musculoskeletal: Normal range of motion.  Skin:    General: Skin is warm and dry.  Neurological:     Mental Status: He is alert.  Psychiatric:        Mood and Affect: Mood normal.      UC Treatments / Results  Labs (all labs ordered are listed, but only abnormal results are displayed) Labs Reviewed - No data to display  EKG None  Radiology Dg Chest 2 View  Result Date: 04/24/2019 CLINICAL DATA:  Cough EXAM: CHEST - 2 VIEW COMPARISON:  December 08, 2018 FINDINGS: No edema or consolidation. The heart size and pulmonary vascularity are normal. No adenopathy. No bone lesions. IMPRESSION: No edema or consolidation. Electronically Signed   By: Lowella Grip III M.D.   On: 04/24/2019 15:00    Procedures Procedures (including critical care time)  Medications Ordered in UC Medications - No data to display  Initial Impression /  Assessment and Plan / UC Course  I have reviewed the triage vital signs and the nursing notes.  Pertinent labs & imaging results that were  available during my care of the patient were reviewed by me and considered in my medical decision making (see chart for details).     Patient is a 28 year old male that presents with chronic cough that has been intermittent over the last 2 months. He is having allergy symptoms. He has been taking Claritin and Tessalon [place] without much relief. Reports that recently the cough got more harsh and deeper Chest x-ray here today revealed no acute abnormalities. I believe this is allergy related Recommend he switch his antihistamine and maybe add Flonase for allergy symptoms He can use the albuterol inhaler as needed for cough, wheezing or shortness of breath Follow-up with primary care for further management Final Clinical Impressions(s) / UC Diagnoses   Final diagnoses:  None     Discharge Instructions     Your chest x-ray was normal.  I believe this is most likely allergy related. You could try switching your antihistamine from Claritin to Zyrtec to see if this helps with your symptoms. Use the albuterol inhaler as needed for cough, wheezing or shortness of breath. If your symptoms continue you need to follow-up with your primary care for further evaluation management    ED Prescriptions    None     Controlled Substance Prescriptions [place] Controlled Substance Registry consulted? Not Applicable   Orvan July, NP 04/24/19 [phone]

## 2019-04-24 NOTE — Telephone Encounter (Signed)
Patient called back & was transferred to my line. Introduced myself(good morning, this is Tour manager) 5 times with no response from patient.

## 2019-04-24 NOTE — Discharge Instructions (Addendum)
Your chest x-ray was normal.  I believe this is most likely allergy related. You could try switching your antihistamine from Claritin to Zyrtec to see if this helps with your symptoms. Use the albuterol inhaler as needed for cough, wheezing or shortness of breath. If your symptoms continue you need to follow-up with your primary care for further evaluation management

## 2019-04-24 NOTE — Telephone Encounter (Signed)
Patient called to inform PCP that at Urgent Care they suggested his allergy medication be changed, please follow up.

## 2019-04-24 NOTE — Telephone Encounter (Signed)
Spoke with patient & he described a deeper cough than last visit. He states that he is concerned that he has pneumonia. Advised him to be seen at urgent care to be evaluated.

## 2019-04-24 NOTE — Telephone Encounter (Addendum)
Please contact patient to have him schedule a virtual visit to discuss symptoms and treatment.

## 2019-04-24 NOTE — ED Triage Notes (Signed)
Patient presents to Urgent Care with complaints of dry cough and vomiting since 2 months ago. Patient reports he was told to come here by his PCP to have a chest x-ray to rule out pneumonia.

## 2019-04-25 ENCOUNTER — Encounter: Payer: Self-pay | Admitting: Family Medicine

## 2019-04-25 ENCOUNTER — Ambulatory Visit (INDEPENDENT_AMBULATORY_CARE_PROVIDER_SITE_OTHER): Payer: Self-pay | Admitting: Family Medicine

## 2019-04-25 DIAGNOSIS — I10 Essential (primary) hypertension: Secondary | ICD-10-CM

## 2019-04-25 DIAGNOSIS — R05 Cough: Secondary | ICD-10-CM

## 2019-04-25 DIAGNOSIS — R053 Chronic cough: Secondary | ICD-10-CM

## 2019-04-25 MED ORDER — CHLORTHALIDONE 50 MG PO TABS: 50.0000 mg | ORAL_TABLET | Freq: Every day | ORAL | 3 refills | Status: DC

## 2019-04-25 NOTE — Progress Notes (Signed)
Virtual Visit via Telephone Note  I connected with Nathan Bradford on 04/25/19 at  3:10 PM EDT by telephone and verified that I am speaking with the correct person using two identifiers.  Location: Patient: Located at home during today's encounter  Provider: Located at primary care office    I discussed the limitations, risks, security and privacy concerns of performing an evaluation and management service by telephone and the availability of in person appointments. I also discussed with the patient that there may be a patient responsible charge related to this service. The patient expressed understanding and agreed to proceed.  History of Present Illness: Nathan Bradford is present via phone for today's encounter for follow-up of an on-going cough. He was initially evaluation for this problem on 03/09/19. The cough was dry, associated with mild post-nasal drainage, and nasal congestion. Cough was thought to be related to seasonal allergies. He was prescribed loratadine and benzonatate for cough. He took medication and cough improve, although never completely resolved. On 04/02/19, he was evaluated for persistent cough and albuterol was added. He was seen at urgent care yesterday and had a normal chest x-ray. Provider recommended changing antihistamine therapy. He continues to endorse dry cough and wheezing. He is not experiencing any nasal congestion, drainage, or PND. Nathan Bradford is currently on ACE inhibitor for blood pressure control which was initiated around February 2020.  Assessment and Plan: 1. Persistent cough for 3 weeks or longer -Suspect this cough is possibly related to ACE inhibitor use. Discontinuing Lisinopril-HCTZ. -For now will refrain from changing antihistamine treatment as patient is negative of allergy symptoms of congestion, itching of eyes, or nasal drainage.   2. Essential hypertension Suspect ACE intolerance, discontinue lisinopril  -Start Chlorthalidone 50 mg once daily   Follow Up  Instructions: Return for a face to face hypertension follow-up and cough follow-up 05/18/19   I discussed the assessment and treatment plan with the patient. The patient was provided an opportunity to ask questions and all were answered. The patient agreed with the plan and demonstrated an understanding of the instructions.   The patient was advised to call back or seek an in-person evaluation if the symptoms worsen or if the condition fails to improve as anticipated.  I provided 20 minutes of non-face-to-face time during this encounter.   Molli Barrows, FNP

## 2019-04-25 NOTE — Progress Notes (Signed)
Called patient to initiate their telephone visit with provider Molli Barrows, FNP-C. Verified date of birth. Patient here to discuss changing allergy medication due to recurring cough. KWalker, CMA.

## 2019-05-14 ENCOUNTER — Telehealth: Payer: Self-pay | Admitting: *Deleted

## 2019-05-14 NOTE — Progress Notes (Deleted)
Follow up for Blood pressure   Patient c/o lo of hearing from the left ear and the only thing he hears in that ear is static, can not see from a distance in both eye. Patient do not have an eye doctor

## 2019-05-14 NOTE — Telephone Encounter (Signed)
Called patient to do their pre-visit COVID screening.  Have you recently traveled internationally([place], [place], [place], [place], [place]) or within the [place] to a hotspot area([place], [place], [place], [place], [place])? No  Are you currently experiencing any of the following: fever, cough, SHOB, fatigue, body aches, loss of smell, rash, diarrhea, vomiting, severe headaches, weakness, sore throat? No  Have you been in contact with anyone who has recently travelled? No  Have you been in contact with anyone who is experiencing any of the above symptoms or been diagnosed with [place]  or works in or has recently visited a SNF? No

## 2019-05-15 ENCOUNTER — Other Ambulatory Visit: Payer: Self-pay

## 2019-05-15 ENCOUNTER — Encounter: Payer: Self-pay | Admitting: Family Medicine

## 2019-05-15 ENCOUNTER — Ambulatory Visit (INDEPENDENT_AMBULATORY_CARE_PROVIDER_SITE_OTHER): Payer: Self-pay | Admitting: Family Medicine

## 2019-05-15 VITALS — BP 167/106 | HR 108 | Temp 98.9°F | Ht 71.0 in | Wt 319.2 lb

## 2019-05-15 DIAGNOSIS — E669 Obesity, unspecified: Secondary | ICD-10-CM

## 2019-05-15 DIAGNOSIS — M549 Dorsalgia, unspecified: Secondary | ICD-10-CM

## 2019-05-15 DIAGNOSIS — I10 Essential (primary) hypertension: Secondary | ICD-10-CM

## 2019-05-15 DIAGNOSIS — M5430 Sciatica, unspecified side: Secondary | ICD-10-CM

## 2019-05-15 DIAGNOSIS — H9192 Unspecified hearing loss, left ear: Secondary | ICD-10-CM

## 2019-05-15 DIAGNOSIS — Z6841 Body Mass Index (BMI) 40.0 and over, adult: Secondary | ICD-10-CM

## 2019-05-15 MED ORDER — AMLODIPINE BESYLATE 10 MG PO TABS: 10.0000 mg | ORAL_TABLET | Freq: Every day | ORAL | 5 refills | Status: DC

## 2019-05-15 MED ORDER — AMLODIPINE BESYLATE 10 MG PO TABS: 10.0000 mg | ORAL_TABLET | Freq: Every day | ORAL | 3 refills | Status: DC

## 2019-05-15 NOTE — Patient Instructions (Addendum)

## 2019-05-15 NOTE — Progress Notes (Signed)
Patient ID: Nathan Bradford, male    DOB: [birth year], 28 y.o.   MRN: [id number]  PCP: Scot Jun, FNP  Chief Complaint  Patient presents with  . Hypertension  . Ear Problem    Subjective:  HPI Nathan Bradford is a 28 y.o. male presents for evaluation hypertension follow-up.   has Generalized anxiety disorder; Panic disorder; Nonspecific ST-T wave electrocardiographic changes; Essential hypertension; BMI 40.0-44.9, adult ([place]); Chest discomfort; and Diabetes mellitus due to underlying condition with unspecified complications ([place]) on their problem list.   Today's visit: Essential hypertension No home monitoring of blood pressure.  Patient had recent medication changes due to ACE induced cough.  He is currently prescribed chlorthalidone.  He reports since starting chlorthalidone developing symptoms of toxicity including ringing in the ear and difficulty hearing.  He reports he symptoms started initially with first dose of medication.  He has continued to take medication in spite of noticing side effects.  Back Pain Requesting orthopedic referral for chronic back pain. Reports being hit in the back with forklift 2016 and has experienced back since that time. Of recent, the pain has worsened. He has not taken any medication for back pain. Social History   Socioeconomic History  . Marital status: Single    Spouse name: Not on file  . Number of children: Not on file  . Years of education: Not on file  . Highest education level: Not on file  Occupational History  . Not on file  Social Needs  . Financial resource strain: Not on file  . Food insecurity:    Worry: Not on file    Inability: Not on file  . Transportation needs:    Medical: Not on file    Non-medical: Not on file  Tobacco Use  . Smoking status: Never Smoker  . Smokeless tobacco: Never Used  Substance and Sexual Activity  . Alcohol use: No  . Drug use: No  . Sexual activity: Not on file  Lifestyle  . Physical  activity:    Days per week: Not on file    Minutes per session: Not on file  . Stress: Not on file  Relationships  . Social connections:    Talks on phone: Not on file    Gets together: Not on file    Attends religious service: Not on file    Active member of club or organization: Not on file    Attends meetings of clubs or organizations: Not on file    Relationship status: Not on file  . Intimate partner violence:    Fear of current or ex partner: Not on file    Emotionally abused: Not on file    Physically abused: Not on file    Forced sexual activity: Not on file  Other Topics Concern  . Not on file  Social History Narrative  . Not on file    Family History  Problem Relation Age of Onset  . Hypertension Mother   . Hypertension Father    Review of Systems  Pertinent negatives listed in HPI Allergies  Allergen Reactions  . Lisinopril Cough    Prior to Admission medications   Medication Sig Start Date End Date Taking? Authorizing Provider  albuterol (VENTOLIN HFA) 108 (90 Base) MCG/ACT inhaler Inhale 2 puffs into the lungs every 6 (six) hours as needed for wheezing or shortness of breath. 04/02/19  Yes Scot Jun, FNP  atorvastatin (LIPITOR) 20 MG tablet Take 1 tablet (20 mg total) by mouth daily.  01/04/19  Yes Scot Jun, FNP  benzonatate (TESSALON) 100 MG capsule Take 1-2 capsules (100-200 mg total) by mouth 3 (three) times daily as needed for cough. 04/02/19  Yes Scot Jun, FNP  carvedilol (COREG) 12.5 MG tablet Take 1 tablet (12.5 mg total) by mouth 2 (two) times daily with a meal. 03/09/19  Yes Scot Jun, FNP  chlorthalidone (HYGROTON) 50 MG tablet Take 1 tablet (50 mg total) by mouth daily. 04/25/19  Yes Scot Jun, FNP  FLUoxetine (PROZAC) 20 MG capsule Take 20 mg by mouth daily.   Yes [provider]  hydrOXYzine (ATARAX/VISTARIL) 25 MG tablet Take 25 mg by mouth 3 (three) times daily.   Yes [provider]   loratadine (CLARITIN) 10 MG tablet Take 1 tablet (10 mg total) by mouth daily. 03/09/19  Yes Scot Jun, FNP  metFORMIN (GLUCOPHAGE) 500 MG tablet Take 1 tablet (500 mg total) by mouth daily with breakfast. 01/04/19  Yes Scot Jun, FNP  traZODone (DESYREL) 50 MG tablet Take 50 mg by mouth at bedtime.   Yes [provider]    Past Medical, Surgical Family and Social History reviewed and updated.    Objective:   Today's Vitals   05/15/19 1608 05/15/19 1613  BP: (!) 148/97 (!) 167/106  Pulse: (!) 108 (!) 108  Temp: 98.9 F (37.2 C)   Weight: (!) 319 lb 3.2 oz (144.8 kg)   Height: 5\' 11"  (1.803 m)   PainSc: 0-No pain     BP Readings from Last 3 Encounters:  05/15/19 (!) 167/106  04/24/19 (!) 166/100  03/09/19 (!) 138/93    Filed Weights   05/15/19 1608  Weight: (!) 319 lb 3.2 oz (144.8 kg)       Physical Exam General appearance: alert, well developed, well nourished, cooperative and in no distress Head: Normocephalic, without obvious abnormality, atraumatic Ears: TM visible. No cerumen present. Respiratory: Respirations even and unlabored, normal respiratory rate Heart: rate and rhythm normal. No gallop or murmurs noted on exam  Abdomen: BS +, no distention, no rebound tenderness, or no mass Extremities: No gross deformities Skin: Skin color, texture, turgor normal. No rashes seen  Psych: Appropriate mood and affect. Neurologic: Mental status: Alert, oriented to person, place, and time, thought content appropriate.  Lab Results  Component Value Date   HGBA1C 5.8 (H) 03/09/2019    Assessment & Plan:  1. Essential hypertension, uncontrolled today Discontinue chlorthalidone due to suspected ototoxicity.  Will add amlodipine 10 mg once daily.  Continue carvedilol 12.5 mg twice daily.  2. Hearing loss of left ear, unspecified hearing loss type Suspect ototoxicity from recent medication change. On exam, bilateral ear canals are free of cerumen  impaction. Will discontinue chlorthalidone.  If hearing issues persist after medication has been discontinued will refer to pain for hearing evaluation.  3. Back pain with sciatica - Ambulatory referral to Orthopedic Surgery     Molli Barrows, FNP [facility] [address], [place] [phone]: [phone]

## 2019-05-18 ENCOUNTER — Ambulatory Visit: Payer: Self-pay | Admitting: Family Medicine

## 2019-05-29 ENCOUNTER — Ambulatory Visit (INDEPENDENT_AMBULATORY_CARE_PROVIDER_SITE_OTHER): Payer: Self-pay

## 2019-05-29 ENCOUNTER — Other Ambulatory Visit: Payer: Self-pay

## 2019-05-29 VITALS — BP 131/90 | HR 80 | Temp 98.1°F

## 2019-05-29 DIAGNOSIS — Z0131 Encounter for examination of blood pressure with abnormal findings: Secondary | ICD-10-CM

## 2019-05-29 DIAGNOSIS — I10 Essential (primary) hypertension: Secondary | ICD-10-CM

## 2019-05-29 NOTE — Progress Notes (Signed)
Patient here for BP check. Has taken BP medication this morning. States that cough has resolved with BP medication change. BP was 131/90, pulse 80. Spoke with provider who states to have him continue current medications & follow up as scheduled. KWalker, CMA.

## 2019-07-16 ENCOUNTER — Telehealth: Payer: Self-pay | Admitting: Family Medicine

## 2019-07-16 NOTE — Telephone Encounter (Signed)
Pt has been having stomach acid reflux and would like to know what they can take to help them with this problem, pease follow up

## 2019-07-17 NOTE — Telephone Encounter (Signed)
Attempted to call pt; LVM informing to call back so we can schedule a televisit

## 2019-07-17 NOTE — Telephone Encounter (Signed)
Patient needs to schedule a televisit to discuss GERD symptoms

## 2019-07-24 ENCOUNTER — Ambulatory Visit: Payer: Self-pay | Admitting: Internal Medicine

## 2019-08-21 ENCOUNTER — Other Ambulatory Visit: Payer: Self-pay | Admitting: Family Medicine

## 2019-08-21 NOTE — Telephone Encounter (Signed)
Requested medication (s) are due for refill today: yes  Requested medication (s) are on the active medication list: yes  Last refill:  04/24/2019  Future visit scheduled: yes  Notes to clinic: review for refill   Requested Prescriptions  Pending Prescriptions Disp Refills   metFORMIN (GLUCOPHAGE) 500 MG tablet [Pharmacy Med Name: metFORMIN HCl 500 MG Oral Tablet] 30 tablet 0    Sig: TAKE 1 TABLET BY MOUTH DAILY WITH BREAKFAST     There is no refill protocol information for this order

## 2019-09-10 ENCOUNTER — Ambulatory Visit: Payer: Self-pay

## 2019-09-24 ENCOUNTER — Ambulatory Visit: Payer: Self-pay | Admitting: Podiatry

## 2019-10-24 ENCOUNTER — Other Ambulatory Visit: Payer: Self-pay

## 2019-10-24 ENCOUNTER — Encounter (HOSPITAL_COMMUNITY): Payer: Self-pay | Admitting: Emergency Medicine

## 2019-10-24 ENCOUNTER — Emergency Department (HOSPITAL_COMMUNITY)
Admission: EM | Admit: 2019-10-24 | Discharge: 2019-10-24 | Disposition: A | Discharge status: Home / Self Care | Payer: Self-pay | Attending: Emergency Medicine | Admitting: Emergency Medicine

## 2019-10-24 ENCOUNTER — Emergency Department (HOSPITAL_COMMUNITY): Payer: Self-pay

## 2019-10-24 DIAGNOSIS — Z7984 Long term (current) use of oral hypoglycemic drugs: Secondary | ICD-10-CM | POA: Insufficient documentation

## 2019-10-24 DIAGNOSIS — B349 Viral infection, unspecified: Secondary | ICD-10-CM | POA: Insufficient documentation

## 2019-10-24 DIAGNOSIS — R5383 Other fatigue: Secondary | ICD-10-CM | POA: Insufficient documentation

## 2019-10-24 DIAGNOSIS — Z20828 Contact with and (suspected) exposure to other viral communicable diseases: Secondary | ICD-10-CM | POA: Insufficient documentation

## 2019-10-24 DIAGNOSIS — J0110 Acute frontal sinusitis, unspecified: Secondary | ICD-10-CM | POA: Insufficient documentation

## 2019-10-24 DIAGNOSIS — E119 Type 2 diabetes mellitus without complications: Secondary | ICD-10-CM | POA: Insufficient documentation

## 2019-10-24 DIAGNOSIS — R42 Dizziness and giddiness: Secondary | ICD-10-CM | POA: Insufficient documentation

## 2019-10-24 DIAGNOSIS — R0789 Other chest pain: Secondary | ICD-10-CM

## 2019-10-24 DIAGNOSIS — Z79899 Other long term (current) drug therapy: Secondary | ICD-10-CM | POA: Insufficient documentation

## 2019-10-24 DIAGNOSIS — F319 Bipolar disorder, unspecified: Secondary | ICD-10-CM | POA: Insufficient documentation

## 2019-10-24 DIAGNOSIS — R0602 Shortness of breath: Secondary | ICD-10-CM | POA: Insufficient documentation

## 2019-10-24 LAB — BASIC METABOLIC PANEL
Anion gap: 10 (ref 5–15)
BUN: 9 mg/dL (ref 6–20)
CO2: 25 mmol/L (ref 22–32)
Calcium: 8.8 mg/dL — ABNORMAL LOW (ref 8.9–10.3)
Chloride: 101 mmol/L (ref 98–111)
Creatinine, Ser: 1.09 mg/dL (ref 0.61–1.24)
GFR calc Af Amer: >60 mL/min (ref >60)
GFR calc non Af Amer: >60 mL/min (ref >60)
Glucose, Bld: 100 mg/dL — ABNORMAL HIGH (ref 70–99)
Potassium: 3.6 mmol/L (ref 3.5–5.1)
Sodium: 136 mmol/L (ref 135–145)

## 2019-10-24 LAB — CBC
HCT: 43.3 % (ref 39.0–52.0)
Hemoglobin: 13.9 g/dL (ref 13.0–17.0)
MCH: 28.8 pg (ref 26.0–34.0)
MCHC: 32.1 g/dL (ref 30.0–36.0)
MCV: 89.6 fL (ref 80.0–100.0)
Platelets: 378 10*3/uL (ref 150–400)
RBC: 4.83 MIL/uL (ref 4.22–5.81)
RDW: 14 % (ref 11.5–15.5)
WBC: 5.3 10*3/uL (ref 4.0–10.5)
nRBC: 0 % (ref 0.0–0.2)

## 2019-10-24 LAB — TROPONIN I (HIGH SENSITIVITY)
Troponin I (High Sensitivity): 12 ng/L (ref <18)
Troponin I (High Sensitivity): 13 ng/L (ref <18)

## 2019-10-24 MED ORDER — MECLIZINE HCL 25 MG PO TABS
25.0000 mg | ORAL_TABLET | Freq: Once | ORAL | Status: AC
  Administered 2019-10-24: 25 mg via ORAL
  Filled 2019-10-24: qty 1

## 2019-10-24 MED ORDER — SODIUM CHLORIDE 0.9 % IV BOLUS
500.0000 mL | Freq: Once | INTRAVENOUS | Status: AC
  Administered 2019-10-24: 500 mL via INTRAVENOUS

## 2019-10-24 MED ORDER — SODIUM CHLORIDE 0.9% FLUSH: 3.0000 mL | Freq: Once | INTRAVENOUS | Status: DC

## 2019-10-24 MED ORDER — MECLIZINE HCL 25 MG PO TABS: 25.0000 mg | ORAL_TABLET | Freq: Three times a day (TID) | ORAL | 0 refills | Status: DC | PRN

## 2019-10-24 MED ORDER — AMOXICILLIN-POT CLAVULANATE 875-125 MG PO TABS: 1.0000 | ORAL_TABLET | Freq: Two times a day (BID) | ORAL | 0 refills | Status: DC

## 2019-10-24 NOTE — Discharge Instructions (Addendum)
Eat and drink as well as you can.  Stay away from people until your test comes back. Return for worsening dizziness inability to walk, sob     Person Under Monitoring Name: Nathan Bradford  Location: [address] [place] [address]   Infection Prevention Recommendations for Individuals Confirmed to have, or Being Evaluated for, 2019 Novel Coronavirus (COVID-19) Infection Who Receive Care at Home  Individuals who are confirmed to have, or are being evaluated for, COVID-19 should follow the prevention steps below until a healthcare provider or local or state health department says they can return to normal activities.  Stay home except to get medical care You should restrict activities outside your home, except for getting medical care. Do not go to work, school, or public areas, and do not use public transportation or taxis.  Call ahead before visiting your doctor Before your medical appointment, call the healthcare provider and tell them that you have, or are being evaluated for, COVID-19 infection. This will help the healthcare providers office take steps to keep other people from getting infected. Ask your healthcare provider to call the local or state health department.  Monitor your symptoms Seek prompt medical attention if your illness is worsening (e.g., difficulty breathing). Before going to your medical appointment, call the healthcare provider and tell them that you have, or are being evaluated for, COVID-19 infection. Ask your healthcare provider to call the local or state health department.  Wear a facemask You should wear a facemask that covers your nose and mouth when you are in the same room with other people and when you visit a healthcare provider. People who live with or visit you should also wear a facemask while they are in the same room with you.  Separate yourself from other people in your home As much as possible, you should stay in a different room  from other people in your home. Also, you should use a separate bathroom, if available.  Avoid sharing household items You should not share dishes, drinking glasses, cups, eating utensils, towels, bedding, or other items with other people in your home. After using these items, you should wash them thoroughly with soap and water.  Cover your coughs and sneezes Cover your mouth and nose with a tissue when you cough or sneeze, or you can cough or sneeze into your sleeve. Throw used tissues in a lined trash can, and immediately wash your hands with soap and water for at least 20 seconds or use an alcohol-based hand rub.  Wash your [facility] your hands often and thoroughly with soap and water for at least 20 seconds. You can use an alcohol-based hand sanitizer if soap and water are not available and if your hands are not visibly dirty. Avoid touching your eyes, nose, and mouth with unwashed hands.   Prevention Steps for Caregivers and Household Members of Individuals Confirmed to have, or Being Evaluated for, COVID-19 Infection Being Cared for in the Home  If you live with, or provide care at home for, a person confirmed to have, or being evaluated for, COVID-19 infection please follow these guidelines to prevent infection:  Follow healthcare providers instructions Make sure that you understand and can help the patient follow any healthcare provider instructions for all care.  Provide for the patients basic needs You should help the patient with basic needs in the home and provide support for getting groceries, prescriptions, and other personal needs.  Monitor the patients symptoms If they are getting  sicker, call his or her medical provider and tell them that the patient has, or is being evaluated for, COVID-19 infection. This will help the healthcare providers office take steps to keep other people from getting infected. Ask the healthcare provider to call the local or state  health department.  Limit the number of people who have contact with the patient If possible, have only one caregiver for the patient. Other household members should stay in another home or place of residence. If this is not possible, they should stay in another room, or be separated from the patient as much as possible. Use a separate bathroom, if available. Restrict visitors who do not have an essential need to be in the home.  Keep older adults, very young children, and other sick people away from the patient Keep older adults, very young children, and those who have compromised immune systems or chronic health conditions away from the patient. This includes people with chronic heart, lung, or kidney conditions, diabetes, and cancer.  Ensure good ventilation Make sure that shared spaces in the home have good air flow, such as from an air conditioner or an opened window, weather permitting.  Wash your hands often Wash your hands often and thoroughly with soap and water for at least 20 seconds. You can use an alcohol based hand sanitizer if soap and water are not available and if your hands are not visibly dirty. Avoid touching your eyes, nose, and mouth with unwashed hands. Use disposable paper towels to dry your hands. If not available, use dedicated cloth towels and replace them when they become wet.  Wear a facemask and gloves Wear a disposable facemask at all times in the room and gloves when you touch or have contact with the patients blood, body fluids, and/or secretions or excretions, such as sweat, saliva, sputum, nasal mucus, vomit, urine, or feces.  Ensure the mask fits over your nose and mouth tightly, and do not touch it during use. Throw out disposable facemasks and gloves after using them. Do not reuse. Wash your hands immediately after removing your facemask and gloves. If your personal clothing becomes contaminated, carefully remove clothing and launder. Wash your hands  after handling contaminated clothing. Place all used disposable facemasks, gloves, and other waste in a lined container before disposing them with other household waste. Remove gloves and wash your hands immediately after handling these items.  Do not share dishes, glasses, or other household items with the patient Avoid sharing household items. You should not share dishes, drinking glasses, cups, eating utensils, towels, bedding, or other items with a patient who is confirmed to have, or being evaluated for, COVID-19 infection. After the person uses these items, you should wash them thoroughly with soap and water.  Wash laundry thoroughly Immediately remove and wash clothes or bedding that have blood, body fluids, and/or secretions or excretions, such as sweat, saliva, sputum, nasal mucus, vomit, urine, or feces, on them. Wear gloves when handling laundry from the patient. Read and follow directions on labels of laundry or clothing items and detergent. In general, wash and dry with the warmest temperatures recommended on the label.  Clean all areas the individual has used often Clean all touchable surfaces, such as counters, tabletops, doorknobs, bathroom fixtures, toilets, phones, keyboards, tablets, and bedside tables, every day. Also, clean any surfaces that may have blood, body fluids, and/or secretions or excretions on them. Wear gloves when cleaning surfaces the patient has come in contact with. Use a diluted bleach  solution (e.g., dilute bleach with 1 part bleach and 10 parts water) or a household disinfectant with a label that says EPA-registered for coronaviruses. To make a bleach solution at home, add 1 tablespoon of bleach to 1 quart (4 cups) of water. For a larger supply, add  cup of bleach to 1 gallon (16 cups) of water. Read labels of cleaning products and follow recommendations provided on product labels. Labels contain instructions for safe and effective use of the cleaning product  including precautions you should take when applying the product, such as wearing gloves or eye protection and making sure you have good ventilation during use of the product. Remove gloves and wash hands immediately after cleaning.  Monitor yourself for signs and symptoms of illness Caregivers and household members are considered close contacts, should monitor their health, and will be asked to limit movement outside of the home to the extent possible. Follow the monitoring steps for close contacts listed on the symptom monitoring form.   ? If you have additional questions, contact your local health department or call the epidemiologist on call at [phone] (available 24/7). ? This guidance is subject to change. For the most up-to-date guidance from [facility], please refer to their website: YouBlogs.pl

## 2019-10-24 NOTE — ED Notes (Signed)
ED Provider at bedside. 

## 2019-10-24 NOTE — ED Provider Notes (Signed)
[facility] EMERGENCY DEPARTMENT Provider Note   CSN: [id number] Arrival date & time: 10/24/19  1115     History   Chief Complaint Chief Complaint  Patient presents with  . Chest Pain    HPI Nathan Bradford is a 28 y.o. male.     28 yo M with a chief complaints of feeling unwell.  This been going on for about a week.  No specific symptom in general is felt mildly congested.  Felt today that he was having some lightheadedness.  Has not felt like eating and drinking for the past week.  No sick contacts no cough no shortness of breath.  No fevers.  No abdominal pain.  He started feeling like he was lightheaded when he stood up.  This is also worse with head movement.  Had some ear pain last week but none currently.  Started having some chest pain earlier today.  Left-sided and sharp.  Worse with movement and palpation.  The history is provided by the patient.  Chest Pain Associated symptoms: dizziness, fatigue and shortness of breath   Associated symptoms: no abdominal pain, no fever, no headache, no nausea, no palpitations and no vomiting   Illness Severity:  Moderate Onset quality:  Gradual Duration:  1 week Timing:  Constant Progression:  Worsening Chronicity:  New Associated symptoms: chest pain, congestion, fatigue and shortness of breath   Associated symptoms: no abdominal pain, no diarrhea, no fever, no headaches, no myalgias, no nausea, no rash and no vomiting     Past Medical History:  Diagnosis Date  . Anxiety   . Bipolar 1 disorder (Nathan Bradford)   . Depression   . Vertigo     Patient Active Problem List   Diagnosis Date Noted  . Chest discomfort 01/19/2019  . Diabetes mellitus due to underlying condition with unspecified complications (Nathan Bradford) [id number]  . Nonspecific ST-T wave electrocardiographic changes 12/30/2018  . Essential hypertension 12/30/2018  . BMI 40.0-44.9, adult (Nathan Bradford) 12/30/2018  . Generalized anxiety disorder 04/06/2015  . Panic  disorder 04/06/2015    Past Surgical History:  Procedure Laterality Date  . WISDOM TOOTH EXTRACTION          Home Medications    Prior to Admission medications   Medication Sig Start Date End Date Taking? Authorizing Provider  amLODipine (NORVASC) 10 MG tablet Take 1 tablet (10 mg total) by mouth daily. 05/15/19  Yes Scot Jun, FNP  atorvastatin (LIPITOR) 20 MG tablet Take 1 tablet (20 mg total) by mouth daily. 01/04/19  Yes Scot Jun, FNP  benzonatate (TESSALON) 100 MG capsule Take 100-200 mg by mouth 3 (three) times daily as needed for cough. 08/21/19  Yes [provider]  loratadine (CLARITIN) 10 MG tablet Take 1 tablet (10 mg total) by mouth daily. 03/09/19  Yes Scot Jun, FNP  metFORMIN (GLUCOPHAGE) 500 MG tablet TAKE 1 TABLET BY MOUTH DAILY WITH BREAKFAST Patient taking differently: Take 500 mg by mouth daily.  08/21/19  Yes Nathan Pier, MD  PRESCRIPTION MEDICATION Take 3 tablets by mouth as needed (Anxiety).   Yes [provider]  albuterol (VENTOLIN HFA) 108 (90 Base) MCG/ACT inhaler Inhale 2 puffs into the lungs every 6 (six) hours as needed for wheezing or shortness of breath. 04/02/19   Scot Jun, FNP  amoxicillin-clavulanate (AUGMENTIN) 875-125 MG tablet Take 1 tablet by mouth 2 (two) times daily. 10/24/19   Nathan Etienne, DO  carvedilol (COREG) 12.5 MG tablet Take 1 tablet (12.5 mg  total) by mouth 2 (two) times daily with a meal. Patient taking differently: Take 12.5 mg by mouth daily.  03/09/19   Scot Jun, FNP  FLUoxetine (PROZAC) 20 MG capsule Take 20 mg by mouth daily.    [provider]  hydrOXYzine (ATARAX/VISTARIL) 25 MG tablet Take 25 mg by mouth 3 (three) times daily.    [provider]  meclizine (ANTIVERT) 25 MG tablet Take 1 tablet (25 mg total) by mouth 3 (three) times daily as needed for dizziness. 10/24/19   Nathan Etienne, DO  traZODone (DESYREL) 50 MG tablet Take 50 mg by mouth at  bedtime.    [provider]    Family History Family History  Problem Relation Age of Onset  . Hypertension Mother   . Hypertension Father     Social History Social History   Tobacco Use  . Smoking status: Never Smoker  . Smokeless tobacco: Never Used  Substance Use Topics  . Alcohol use: No  . Drug use: No     Allergies   Chlorthalidone and Lisinopril   Review of Systems Review of Systems  Constitutional: Positive for appetite change (decreased appetite) and fatigue. Negative for chills and fever.  HENT: Positive for congestion. Negative for facial swelling.   Eyes: Negative for discharge and visual disturbance.  Respiratory: Positive for shortness of breath.   Cardiovascular: Positive for chest pain. Negative for palpitations.  Gastrointestinal: Negative for abdominal pain, diarrhea, nausea and vomiting.  Musculoskeletal: Negative for arthralgias and myalgias.  Skin: Negative for color change and rash.  Neurological: Positive for dizziness and light-headedness. Negative for tremors, syncope and headaches.  Psychiatric/Behavioral: Negative for confusion and dysphoric mood.  28   Physical Exam Updated Vital Signs BP (!) 174/117 (BP Location: Right Arm)   Pulse 71   Temp 98.6 F (37 C) (Oral)   Resp 14   SpO2 100%   Physical Exam Vitals signs and nursing note reviewed.  Constitutional:      Appearance: He is well-developed.  HENT:     Head: Normocephalic and atraumatic.     Comments: Swollen turbinates, posterior nasal drip, right frontal sinus exquisitely tender to percussion, tm normal bilaterally.   Eyes:     Pupils: Pupils are equal, round, and reactive to light.  Neck:     Musculoskeletal: Normal range of motion and neck supple.     Vascular: No JVD.  Cardiovascular:     Rate and Rhythm: Normal rate and regular rhythm.     Heart sounds: No murmur. No friction rub. No gallop.   Pulmonary:     Effort: No respiratory distress.     Breath  sounds: No wheezing.  Abdominal:     General: There is no distension.     Tenderness: There is no guarding or rebound.  Musculoskeletal: Normal range of motion.  Skin:    Coloration: Skin is not pale.     Findings: No rash.  Neurological:     Mental Status: He is alert and oriented to person, place, and time.     GCS: GCS eye subscore is 4. GCS verbal subscore is 5. GCS motor subscore is 6.     Cranial Nerves: Cranial nerves are intact.     Sensory: Sensation is intact.     Motor: Motor function is intact.     Coordination: Coordination is intact.     Gait: Gait is intact.     Comments: Left-sided fast going nystagmus.  Otherwise benign neurologic exam.  Psychiatric:        Behavior: Behavior normal.      ED Treatments / Results  Labs (all labs ordered are listed, but only abnormal results are displayed) Labs Reviewed  BASIC METABOLIC PANEL - Abnormal; Notable for the following components:      Result Value   Glucose, Bld 100 (*)    Calcium 8.8 (*)    All other components within normal limits  SARS CORONAVIRUS 2 (TAT 6-24 HRS)  CBC  TROPONIN I (HIGH SENSITIVITY)  TROPONIN I (HIGH SENSITIVITY)    EKG EKG Interpretation  Date/Time:  Wednesday October 24 2019 11:33:26 EST Ventricular Rate:  85 PR Interval:  176 QRS Duration: 92 QT Interval:  376 QTC Calculation: 447 R Axis:   90 Text Interpretation: Normal sinus rhythm Rightward axis T wave abnormality, consider inferolateral ischemia Abnormal ECG No significant change since last tracing Confirmed by Nathan Bradford [phone]) on 10/24/2019 5:55:57 PM   Radiology Dg Chest 2 View  Result Date: 10/24/2019 CLINICAL DATA:  Chest pain EXAM: CHEST - 2 VIEW COMPARISON:  Apr 24, 2019 FINDINGS: The heart size and mediastinal contours are within normal limits. Both lungs are clear. The visualized skeletal structures are unremarkable. IMPRESSION: No active cardiopulmonary disease. Electronically Signed   By: Dorise Bullion III M.D    On: 10/24/2019 11:51    Procedures Procedures (including critical care time)  Medications Ordered in ED Medications  sodium chloride flush (NS) 0.9 % injection 3 mL (has no administration in time range)  sodium chloride 0.9 % bolus 500 mL (0 mLs Intravenous Stopped 10/24/19 2112)  meclizine (ANTIVERT) tablet 25 mg (25 mg Oral Given 10/24/19 2006)     Initial Impression / Assessment and Plan / ED Course  I have reviewed the triage vital signs and the nursing notes.  Pertinent labs & imaging results that were available during my care of the patient were reviewed by me and considered in my medical decision making (see chart for details).        28 yo M with a chief complaints of dizziness.  Going on for the past day.  Was preceded by feeling unwell having some fatigue and shortness of breath.  Having some congestion as well.  History more consistent with a viral syndrome.  He has right frontal sinus tenderness to percussion.  Has a benign neurologic exam.  Will treat as sinusitis.  He had 2 troponins that were negative.  EKG is unchanged.  Chest x-ray viewed by me without focal infiltrate or pneumothorax.  D/c home.  10:28 PM:  I have discussed the diagnosis/risks/treatment options with the patient and believe the pt to be eligible for discharge home to follow-up with PCP. We also discussed returning to the ED immediately if new or worsening sx occur. We discussed the sx which are most concerning (e.g., sudden worsening pain, fever, inability to tolerate by mouth) that necessitate immediate return. Medications administered to the patient during their visit and any new prescriptions provided to the patient are listed below.  Medications given during this visit Medications  sodium chloride flush (NS) 0.9 % injection 3 mL (has no administration in time range)  sodium chloride 0.9 % bolus 500 mL (0 mLs Intravenous Stopped 10/24/19 2112)  meclizine (ANTIVERT) tablet 25 mg (25 mg Oral  Given 10/24/19 2006)     The patient appears reasonably screen and/or stabilized for discharge and I doubt any other medical condition or other [facility] requiring further screening, evaluation, or treatment in the  ED at this time prior to discharge.    Final Clinical Impressions(s) / ED Diagnoses   Final diagnoses:  Viral syndrome  Dizziness  Acute frontal sinusitis, recurrence not specified  Chest wall pain    ED Discharge Orders         Ordered    meclizine (ANTIVERT) 25 MG tablet  3 times daily PRN     10/24/19 1950    amoxicillin-clavulanate (AUGMENTIN) 875-125 MG tablet  2 times daily     10/24/19 [place], Angie Hogg, DO 10/24/19 2228

## 2019-10-24 NOTE — ED Triage Notes (Signed)
[place] and dizziness , pt 2 nitro and 324mg  [facility] from [facility] , pain is worse with movement

## 2019-10-25 LAB — SARS CORONAVIRUS 2 (TAT 6-24 HRS): SARS Coronavirus 2: NEGATIVE

## 2019-10-31 ENCOUNTER — Ambulatory Visit: Payer: Self-pay | Admitting: Podiatry

## 2019-11-14 ENCOUNTER — Ambulatory Visit: Payer: Self-pay | Admitting: Podiatry

## 2019-12-17 ENCOUNTER — Ambulatory Visit: Payer: Self-pay | Admitting: Podiatry

## 2020-01-07 ENCOUNTER — Ambulatory Visit: Payer: Self-pay | Admitting: Podiatry

## 2020-01-20 IMAGING — DX CHEST - 2 VIEW
2 series · 2 of 2 positions shown · non-contrast
Comparison: December 08, 2018

CLINICAL DATA: Cough

EXAM:
CHEST - 2 VIEW

[chest pa]
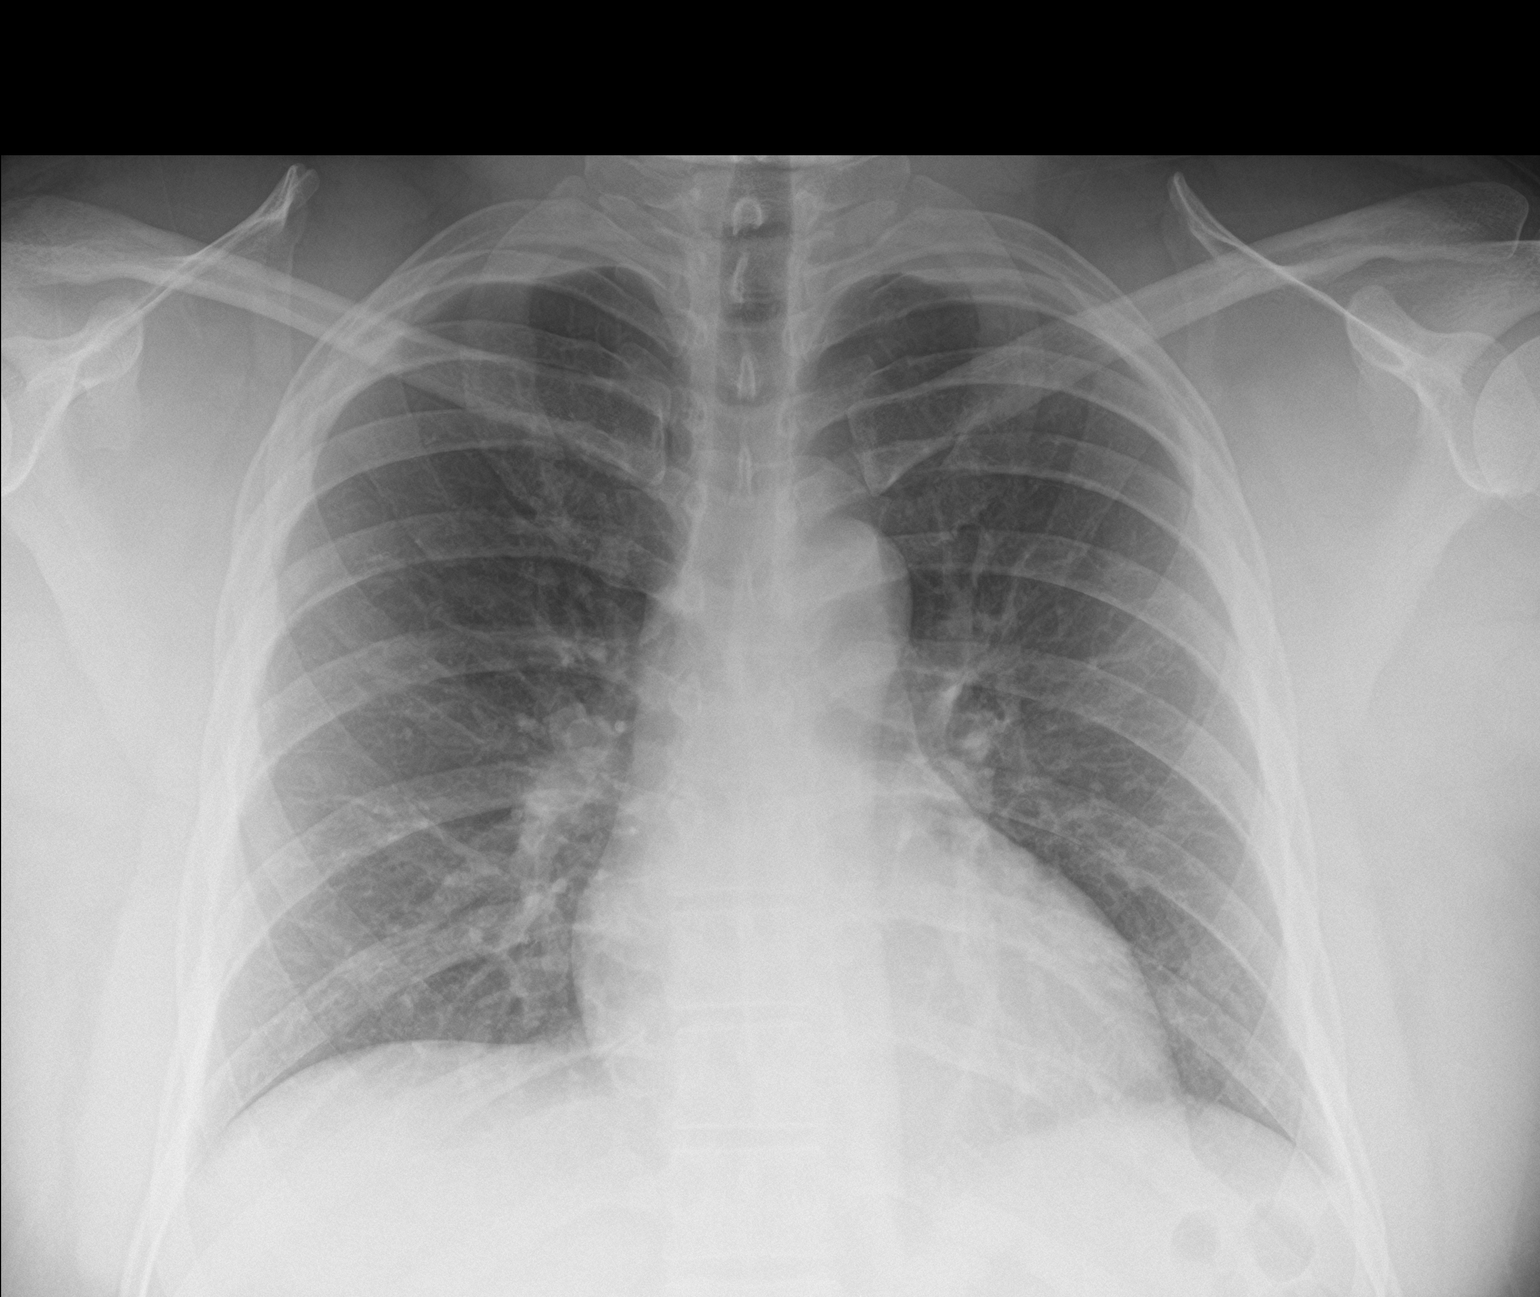

[chest lat]
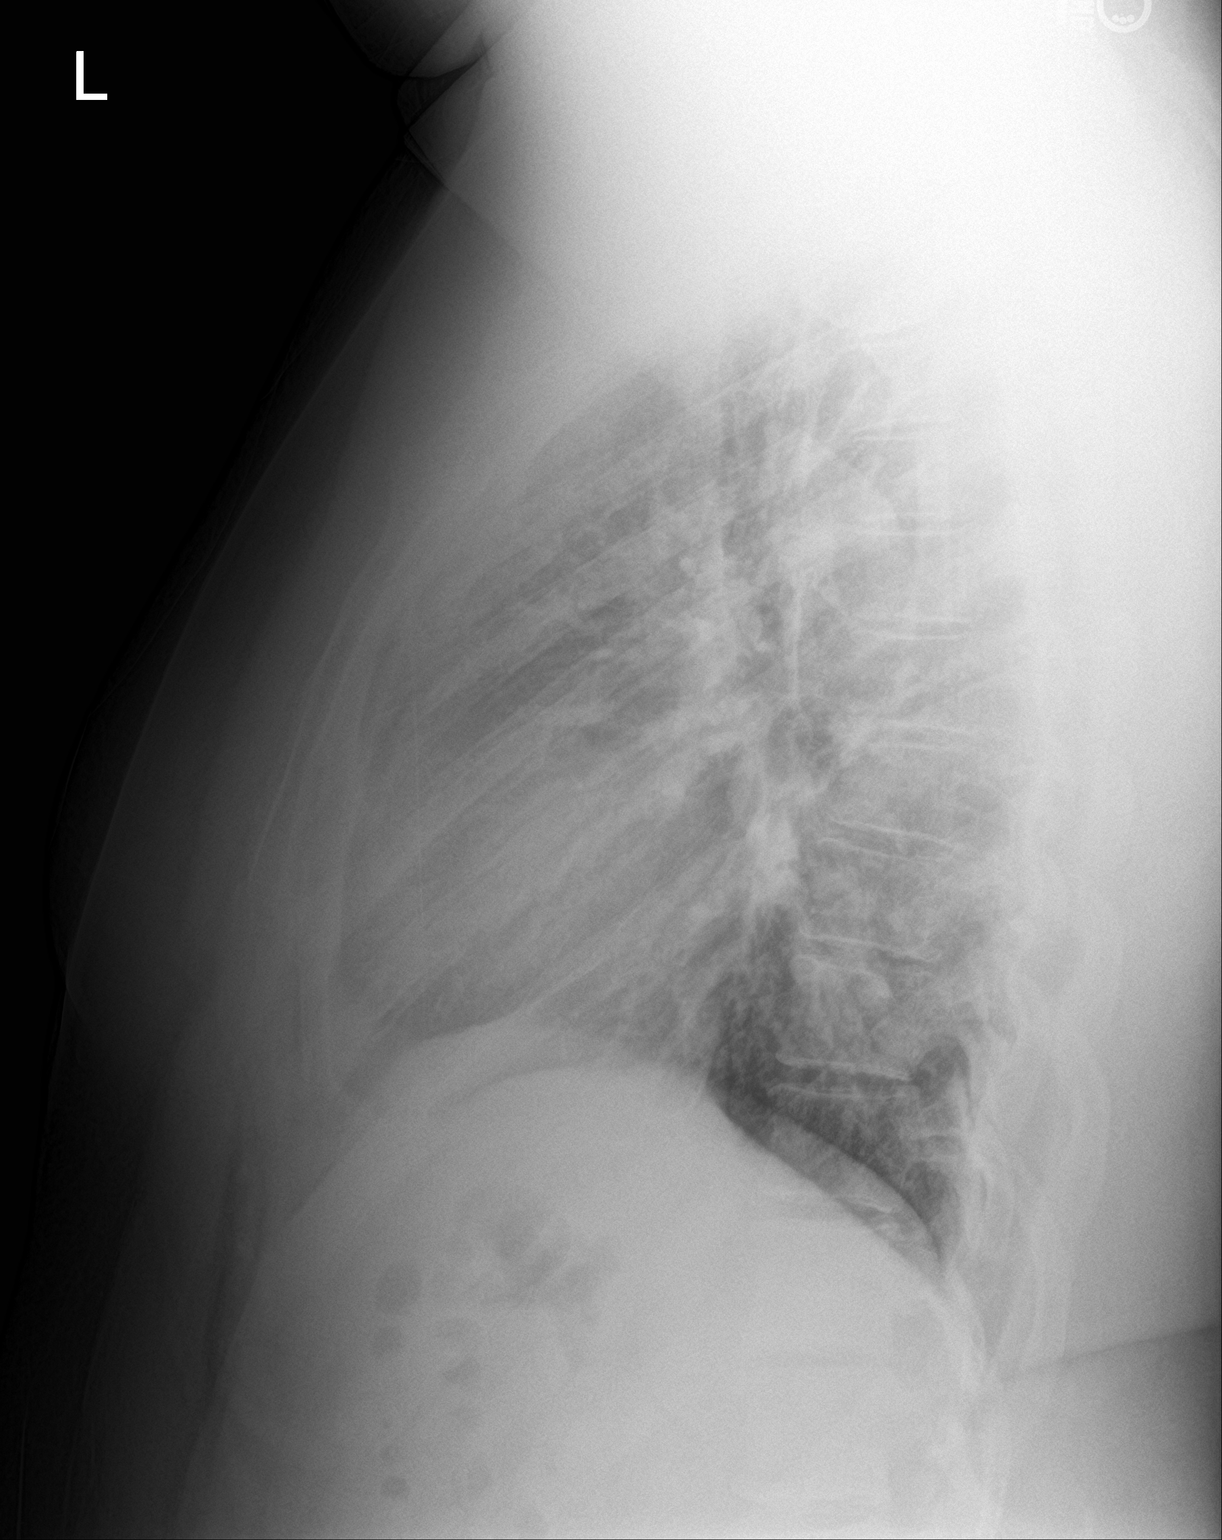

[2 of 2 positions shown; findings below may reference images not displayed]

FINDINGS: No edema or consolidation. The heart size and pulmonary vascularity
are normal. No adenopathy. No bone lesions.
IMPRESSION: No edema or consolidation.

## 2020-06-03 ENCOUNTER — Other Ambulatory Visit: Payer: Self-pay | Admitting: Family Medicine

## 2020-07-24 ENCOUNTER — Other Ambulatory Visit: Payer: Self-pay | Admitting: Family Medicine

## 2020-09-23 ENCOUNTER — Other Ambulatory Visit: Payer: Self-pay | Admitting: Family Medicine

## 2020-09-23 ENCOUNTER — Encounter: Payer: Self-pay | Admitting: Internal Medicine

## 2020-09-23 ENCOUNTER — Telehealth (INDEPENDENT_AMBULATORY_CARE_PROVIDER_SITE_OTHER): Payer: 59 | Admitting: Internal Medicine

## 2020-09-23 DIAGNOSIS — Z1159 Encounter for screening for other viral diseases: Secondary | ICD-10-CM

## 2020-09-23 DIAGNOSIS — E785 Hyperlipidemia, unspecified: Secondary | ICD-10-CM

## 2020-09-23 DIAGNOSIS — I10 Essential (primary) hypertension: Secondary | ICD-10-CM | POA: Diagnosis not present

## 2020-09-23 DIAGNOSIS — F411 Generalized anxiety disorder: Secondary | ICD-10-CM

## 2020-09-23 DIAGNOSIS — R7303 Prediabetes: Secondary | ICD-10-CM | POA: Diagnosis not present

## 2020-09-23 DIAGNOSIS — Z7689 Persons encountering health services in other specified circumstances: Secondary | ICD-10-CM

## 2020-09-23 DIAGNOSIS — Z114 Encounter for screening for human immunodeficiency virus [HIV]: Secondary | ICD-10-CM

## 2020-09-23 DIAGNOSIS — F319 Bipolar disorder, unspecified: Secondary | ICD-10-CM

## 2020-09-23 MED ORDER — ALBUTEROL SULFATE HFA 108 (90 BASE) MCG/ACT IN AERS: 2.0000 | INHALATION_SPRAY | Freq: Four times a day (QID) | RESPIRATORY_TRACT | 5 refills | Status: DC | PRN

## 2020-09-23 MED ORDER — CARVEDILOL 12.5 MG PO TABS: 12.5000 mg | ORAL_TABLET | Freq: Two times a day (BID) | ORAL | 5 refills | Status: DC

## 2020-09-23 MED ORDER — ATORVASTATIN CALCIUM 20 MG PO TABS: 20.0000 mg | ORAL_TABLET | Freq: Every day | ORAL | 5 refills | Status: DC

## 2020-09-23 MED ORDER — AMLODIPINE BESYLATE 10 MG PO TABS: 10.0000 mg | ORAL_TABLET | Freq: Every day | ORAL | 5 refills | Status: DC

## 2020-09-23 MED ORDER — BENZONATATE 100 MG PO CAPS: 100.0000 mg | ORAL_CAPSULE | Freq: Three times a day (TID) | ORAL | 1 refills | Status: DC | PRN

## 2020-09-23 MED ORDER — LORATADINE 10 MG PO TABS: 10.0000 mg | ORAL_TABLET | Freq: Every day | ORAL | 11 refills | Status: DC

## 2020-09-23 MED ORDER — METFORMIN HCL 500 MG PO TABS: 500.0000 mg | ORAL_TABLET | Freq: Every day | ORAL | 5 refills | Status: DC

## 2020-09-23 NOTE — Progress Notes (Signed)
Virtual Visit via Telephone Note  I connected with Nathan Bradford, on 09/23/2020 at 2:42 PM by telephone due to the COVID-19 pandemic and verified that I am speaking with the correct person using two identifiers.   Consent: I discussed the limitations, risks, security and privacy concerns of performing an evaluation and management service by telephone and the availability of in person appointments. I also discussed with the patient that there may be a patient responsible charge related to this service. The patient expressed understanding and agreed to proceed.   Location of Patient: Home   Location of Provider: Clinic    Persons participating in Telemedicine visit: [facility] Dr. Juleen China      History of Present Illness: Patient has a visit to re-establish care. PMH of HTN, prediabtes, HLD.   Has concerns about anxiety/depression and has a history of Bipolar Disorder. Was being seen at [facility]. Was prescribed a medication but felt like it made him feel worse. Can't recall the name. Ran out of medication and is no longer taking anything. Endorses a lot of mood swings and things getting on his nerves. Reports has passive SI daily as a regular thing. Has no attempts. Says that his will to live weighs out the consideration of harming himself.   Chronic HTN Disease Monitoring:  Home BP Monitoring - Not monitoring  Chest pain- no  Dyspnea- no Headache - no  Medications: Coreg 12.5 mg BID, Amlodipine 10 mg Compliance- yes Lightheadedness- no  Edema- no      Past Medical History:  Diagnosis Date  . Anxiety   . Asthma    Phreesia 09/23/2020  . Bipolar 1 disorder ([place])   . Depression   . Depression    [employer]reesia 09/23/2020  . Diabetes mellitus without complication ([place])    [employer]reesia 09/23/2020  . Hypertension    [employer]reesia 09/23/2020  . Vertigo    Allergies  Allergen Reactions  . Chlorthalidone   . Lisinopril Cough    Current Outpatient  Medications on File Prior to Visit  Medication Sig Dispense Refill  . amLODipine (NORVASC) 10 MG tablet Take 1 tablet (10 mg total) by mouth daily. 30 tablet 5  . atorvastatin (LIPITOR) 20 MG tablet Take 1 tablet (20 mg total) by mouth daily. 90 tablet 3  . carvedilol (COREG) 12.5 MG tablet Take 1 tablet (12.5 mg total) by mouth 2 (two) times daily with a meal. (Patient taking differently: Take 12.5 mg by mouth daily. ) 60 tablet 3  . metFORMIN (GLUCOPHAGE) 500 MG tablet TAKE 1 TABLET BY MOUTH DAILY WITH BREAKFAST (Patient taking differently: Take 500 mg by mouth daily. ) 30 tablet 1   No current facility-administered medications on file prior to visit.    Observations/Objective: NAD. Speaking clearly.  Work of breathing normal.  Alert and oriented. Mood appropriate.   Assessment and Plan: 1. Encounter to establish care Reviewed patient's PMH, social history, surgical history, and medications.  Is overdue for annual exam, screening blood work, and health maintenance topics. Have asked patient to return for visit to address these items.   2. Prediabetes Last A1c 5.8% in March 2020. Repeat to monitor. Encouraged carb modified diet and exercise.  - Hemoglobin A1c; Future - metFORMIN (GLUCOPHAGE) 500 MG tablet; Take 1 tablet (500 mg total) by mouth daily with breakfast.  Dispense: 30 tablet; Refill: 5  3. Essential hypertension Asymptomatic. Not monitoring at home. Will monitor at upcoming annual exam. Continue current regimen.  - CBC with Differential; Future - Comprehensive  metabolic panel; Future - Lipid Panel; Future - amLODipine (NORVASC) 10 MG tablet; Take 1 tablet (10 mg total) by mouth daily.  Dispense: 30 tablet; Refill: 5 - carvedilol (COREG) 12.5 MG tablet; Take 1 tablet (12.5 mg total) by mouth 2 (two) times daily with a meal.  Dispense: 60 tablet; Refill: 5  4. Need for hepatitis C screening test - HCV Ab w/Rflx to Verification; Future  5. Screening for HIV (human  immunodeficiency virus) - HIV antibody (with reflex); Future  6. Hyperlipidemia, unspecified hyperlipidemia type Last LDL was 73 in March 2020.Continue Lipitor and monitor lipid panel.   - atorvastatin (LIPITOR) 20 MG tablet; Take 1 tablet (20 mg total) by mouth daily.  Dispense: 30 tablet; Refill: 5  7. Bipolar I disorder ([place]) - Ambulatory referral to Psychiatry  8. Generalized anxiety disorder - Ambulatory referral to Psychiatry   Follow Up Instructions: Lab visit 10/15; Annual exam    I discussed the assessment and treatment plan with the patient. The patient was provided an opportunity to ask questions and all were answered. The patient agreed with the plan and demonstrated an understanding of the instructions.   The patient was advised to call back or seek an in-person evaluation if the symptoms worsen or if the condition fails to improve as anticipated.     I provided 28 minutes total of non-face-to-face time during this encounter including median intraservice time, reviewing previous notes, investigations, ordering medications, medical decision making, coordinating care and patient verbalized understanding at the end of the visit.    Phill Myron, D.O. Primary Care at [facility]  09/23/2020, 2:42 PM

## 2020-09-26 ENCOUNTER — Other Ambulatory Visit: Payer: Self-pay

## 2020-09-26 ENCOUNTER — Other Ambulatory Visit: Payer: 59

## 2020-09-26 DIAGNOSIS — Z20822 Contact with and (suspected) exposure to covid-19: Secondary | ICD-10-CM

## 2020-09-28 LAB — NOVEL CORONAVIRUS, NAA

## 2020-10-09 ENCOUNTER — Ambulatory Visit (INDEPENDENT_AMBULATORY_CARE_PROVIDER_SITE_OTHER): Payer: 59 | Admitting: Licensed Clinical Social Worker

## 2020-10-09 ENCOUNTER — Other Ambulatory Visit: Payer: Self-pay

## 2020-10-09 DIAGNOSIS — F3162 Bipolar disorder, current episode mixed, moderate: Secondary | ICD-10-CM | POA: Diagnosis not present

## 2020-10-10 ENCOUNTER — Other Ambulatory Visit: Payer: 59

## 2020-10-10 ENCOUNTER — Other Ambulatory Visit: Payer: Self-pay

## 2020-10-10 DIAGNOSIS — Z20822 Contact with and (suspected) exposure to covid-19: Secondary | ICD-10-CM
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
## 2020-10-12 LAB — NOVEL CORONAVIRUS, NAA: SARS-CoV-2, NAA: NOT DETECTED

## 2020-10-12 LAB — SARS-COV-2, NAA 2 DAY TAT

## 2020-10-14 ENCOUNTER — Encounter: Payer: 59 | Admitting: Internal Medicine

## 2020-10-20 ENCOUNTER — Other Ambulatory Visit: Payer: Self-pay

## 2020-10-20 ENCOUNTER — Emergency Department (HOSPITAL_COMMUNITY)
Admission: EM | Admit: 2020-10-20 | Discharge: 2020-10-20 | Disposition: A | Discharge status: Home / Self Care | Payer: 59 | Attending: Emergency Medicine | Admitting: Emergency Medicine

## 2020-10-20 ENCOUNTER — Emergency Department (HOSPITAL_COMMUNITY): Payer: 59

## 2020-10-20 DIAGNOSIS — F410 Panic disorder [episodic paroxysmal anxiety] without agoraphobia: Secondary | ICD-10-CM | POA: Insufficient documentation

## 2020-10-20 DIAGNOSIS — E860 Dehydration: Secondary | ICD-10-CM | POA: Diagnosis not present

## 2020-10-20 DIAGNOSIS — R531 Weakness: Secondary | ICD-10-CM | POA: Diagnosis not present

## 2020-10-20 DIAGNOSIS — Z5321 Procedure and treatment not carried out due to patient leaving prior to being seen by health care provider: Secondary | ICD-10-CM | POA: Insufficient documentation

## 2020-10-20 DIAGNOSIS — R079 Chest pain, unspecified: Secondary | ICD-10-CM | POA: Diagnosis not present

## 2020-10-20 DIAGNOSIS — R42 Dizziness and giddiness: Secondary | ICD-10-CM | POA: Diagnosis present

## 2020-10-20 LAB — CBC
HCT: 41.2 % (ref 39.0–52.0)
Hemoglobin: 13.4 g/dL (ref 13.0–17.0)
MCH: 28.9 pg (ref 26.0–34.0)
MCHC: 32.5 g/dL (ref 30.0–36.0)
MCV: 89 fL (ref 80.0–100.0)
Platelets: 394 10*3/uL (ref 150–400)
RBC: 4.63 MIL/uL (ref 4.22–5.81)
RDW: 13.2 % (ref 11.5–15.5)
WBC: 4.1 10*3/uL (ref 4.0–10.5)
nRBC: 0 % (ref 0.0–0.2)

## 2020-10-20 LAB — BASIC METABOLIC PANEL
Anion gap: 6 (ref 5–15)
BUN: 10 mg/dL (ref 6–20)
CO2: 28 mmol/L (ref 22–32)
Calcium: 9 mg/dL (ref 8.9–10.3)
Chloride: 104 mmol/L (ref 98–111)
Creatinine, Ser: 1.14 mg/dL (ref 0.61–1.24)
GFR, Estimated: >60 mL/min (ref >60)
Glucose, Bld: 100 mg/dL — ABNORMAL HIGH (ref 70–99)
Potassium: 3.6 mmol/L (ref 3.5–5.1)
Sodium: 138 mmol/L (ref 135–145)

## 2020-10-20 LAB — TROPONIN I (HIGH SENSITIVITY): Troponin I (High Sensitivity): 8 ng/L (ref <18)

## 2020-10-20 NOTE — ED Triage Notes (Signed)
Arrived via EMS; c/o he feels tired, dehydrated, chest pain and panic attacks.

## 2020-10-20 NOTE — ED Notes (Signed)
Pt told the risk of leaving. Pt said the wait was to long.

## 2020-10-21 ENCOUNTER — Telehealth (INDEPENDENT_AMBULATORY_CARE_PROVIDER_SITE_OTHER): Payer: 59 | Admitting: Internal Medicine

## 2020-10-21 DIAGNOSIS — F319 Bipolar disorder, unspecified: Secondary | ICD-10-CM | POA: Diagnosis not present

## 2020-10-21 DIAGNOSIS — F411 Generalized anxiety disorder: Secondary | ICD-10-CM

## 2020-10-21 DIAGNOSIS — I10 Essential (primary) hypertension: Secondary | ICD-10-CM | POA: Diagnosis not present

## 2020-10-21 MED ORDER — AMLODIPINE BESYLATE 10 MG PO TABS: 10.0000 mg | ORAL_TABLET | Freq: Every day | ORAL | 5 refills | Status: DC

## 2020-10-21 MED ORDER — BUSPIRONE HCL 7.5 MG PO TABS: 7.5000 mg | ORAL_TABLET | Freq: Two times a day (BID) | ORAL | 1 refills | Status: DC

## 2020-10-21 MED ORDER — ALPRAZOLAM 0.25 MG PO TABS: 0.2500 mg | ORAL_TABLET | Freq: Two times a day (BID) | ORAL | 0 refills | Status: DC | PRN

## 2020-10-21 NOTE — Progress Notes (Signed)
Virtual Visit via Telephone Note  I connected with Nathan Bradford, on 10/21/2020 at 8:59 AM by telephone due to the COVID-19 pandemic and verified that I am speaking with the correct person using two identifiers.   Consent: I discussed the limitations, risks, security and privacy concerns of performing an evaluation and management service by telephone and the availability of in person appointments. I also discussed with the patient that there may be a patient responsible charge related to this service. The patient expressed understanding and agreed to proceed.   Location of Patient: Home   Location of Provider: Clinic    Persons participating in Telemedicine visit: [facility] Dr. Juleen China      History of Present Illness: Patient has a visit for acute concerns. Reports that last month he started having panic attacks. He had to stop driving in the middle of traffic to try to calm himself down. Due to his history of Bipolar Disorder, had referred patient to psych. He was seen by 10/28 by counselor. He has intake appointment 11/30 with psychiatrist. Having panic attacks TID. Not currently taking medications for his mental health diagnoses. Denies episodes of mania with bipolar disorder.   Has concerns about his BP as well. Went to ED yesterday, but was not seen. His BP was 170/109. Prescribed Amlodipine 10 mg and Coreg 12.5 mg BID. Has not been taking Amlodipine and unsure of what happened to his prescription.     Past Medical History:  Diagnosis Date   Anxiety    Asthma    Phreesia 09/23/2020   Bipolar 1 disorder ([place])    Depression    Depression    Phreesia 09/23/2020   Diabetes mellitus without complication ([place])    Phreesia 09/23/2020   Hypertension    Phreesia 09/23/2020   Vertigo    Allergies  Allergen Reactions   Chlorthalidone    Lisinopril Cough    Current Outpatient Medications on File Prior to Visit  Medication Sig Dispense Refill    albuterol (VENTOLIN HFA) 108 (90 Base) MCG/ACT inhaler Inhale 2 puffs into the lungs every 6 (six) hours as needed for wheezing or shortness of breath. 18 g 5   amLODipine (NORVASC) 10 MG tablet Take 1 tablet (10 mg total) by mouth daily. 30 tablet 5   atorvastatin (LIPITOR) 20 MG tablet Take 1 tablet (20 mg total) by mouth daily. 30 tablet 5   benzonatate (TESSALON) 100 MG capsule Take 1-2 capsules (100-200 mg total) by mouth 3 (three) times daily as needed for cough. 20 capsule 1   carvedilol (COREG) 12.5 MG tablet Take 1 tablet (12.5 mg total) by mouth 2 (two) times daily with a meal. 60 tablet 5   loratadine (CLARITIN) 10 MG tablet Take 1 tablet (10 mg total) by mouth daily. 30 tablet 11   metFORMIN (GLUCOPHAGE) 500 MG tablet Take 1 tablet (500 mg total) by mouth daily with breakfast. 30 tablet 5   No current facility-administered medications on file prior to visit.    Observations/Objective: NAD. Speaking clearly.  Work of breathing normal.  Alert and oriented. Mood appropriate.   Assessment and Plan: 1. Generalized anxiety disorder Patient suffering from recurrent panic attacks with history of GAD. Given history of Bipolar disorder, concern about starting monotherapy with SSRI and potential to induce mania. Will start Buspar Rx. Given short term Rx for Xanax given multiple panic attacks daily. Discussed this will not be prescribed long term due to side effects and addiction potential. Needs to f/u with  psych on 11/30 for further management.  - busPIRone (BUSPAR) 7.5 MG tablet; Take 1 tablet (7.5 mg total) by mouth 2 (two) times daily.  Dispense: 60 tablet; Refill: 1 - ALPRAZolam (XANAX) 0.25 MG tablet; Take 1 tablet (0.25 mg total) by mouth 2 (two) times daily as needed for anxiety.  Dispense: 30 tablet; Refill: 0  2. Bipolar I disorder ([place]) See above. Needs assistance of psych management. Referral has been established.   3. Essential hypertension BP above goal at ER visit.  Seems patient has somehow come off Amlodipine. Resume medication. Follow up BP at annual exam next week. Return precautions discussed.  - amLODipine (NORVASC) 10 MG tablet; Take 1 tablet (10 mg total) by mouth daily.  Dispense: 30 tablet; Refill: 5   Follow Up Instructions: Annual exam 11/19    I discussed the assessment and treatment plan with the patient. The patient was provided an opportunity to ask questions and all were answered. The patient agreed with the plan and demonstrated an understanding of the instructions.   The patient was advised to call back or seek an in-person evaluation if the symptoms worsen or if the condition fails to improve as anticipated.     I provided 18 minutes total of non-face-to-face time during this encounter including median intraservice time, reviewing previous notes, investigations, ordering medications, medical decision making, coordinating care and patient verbalized understanding at the end of the visit.    Phill Myron, D.O. Primary Care at [facility]  10/21/2020, 8:59 AM

## 2020-10-28 ENCOUNTER — Ambulatory Visit: Payer: 59

## 2020-10-31 ENCOUNTER — Encounter: Payer: 59 | Admitting: Internal Medicine

## 2020-11-11 ENCOUNTER — Other Ambulatory Visit: Payer: Self-pay

## 2020-11-11 ENCOUNTER — Telehealth (INDEPENDENT_AMBULATORY_CARE_PROVIDER_SITE_OTHER): Payer: 59 | Admitting: Physician Assistant

## 2020-11-11 DIAGNOSIS — F3162 Bipolar disorder, current episode mixed, moderate: Secondary | ICD-10-CM | POA: Diagnosis not present

## 2020-11-11 DIAGNOSIS — F411 Generalized anxiety disorder: Secondary | ICD-10-CM | POA: Diagnosis not present
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
## 2020-11-14 ENCOUNTER — Telehealth (HOSPITAL_COMMUNITY): Payer: Self-pay | Admitting: *Deleted

## 2020-11-14 NOTE — Telephone Encounter (Signed)
Call from patient stating he was expecting medications to be called into [facility] at [facility] but he checked and there was nothing there for him. Will consult Trinna Post PA re patient concern.

## 2020-11-19 ENCOUNTER — Telehealth (HOSPITAL_COMMUNITY): Payer: Self-pay | Admitting: *Deleted

## 2020-11-19 NOTE — Telephone Encounter (Signed)
Patient was contacted yesterday to complete appointment and prescribe medications. Will continue to contact.

## 2020-11-19 NOTE — Telephone Encounter (Signed)
Patient was contacted but no one answered. Detailed message was left regarding potential continue meeting tomorrow morning over the phone. Will continue to contact patient tonight.

## 2020-11-19 NOTE — Telephone Encounter (Signed)
Called stating he needs something done about his medicine. He had called re this issue last week and brought it to attention of the PA but I dont see medicine has been called in for him. Will bring this to [place] PA attention today.

## 2020-11-24 ENCOUNTER — Encounter: Payer: 59 | Admitting: Family

## 2020-11-24 NOTE — Progress Notes (Signed)
Patient did not show for appointment.   

## 2020-11-26 ENCOUNTER — Telehealth (HOSPITAL_COMMUNITY): Payer: Self-pay | Admitting: Licensed Clinical Social Worker

## 2020-11-26 ENCOUNTER — Other Ambulatory Visit: Payer: Self-pay

## 2020-11-26 ENCOUNTER — Ambulatory Visit (HOSPITAL_COMMUNITY): Payer: 59 | Admitting: Licensed Clinical Social Worker

## 2020-11-26 NOTE — Telephone Encounter (Signed)
LCSW sent text link for video session per schedule. When pt failed to sign on LCSW called pt. Call went to vm. LCSW left detailed vm re purpose of call with call back # to learn future session options as desired.

## 2020-12-10 ENCOUNTER — Ambulatory Visit (HOSPITAL_COMMUNITY): Payer: Self-pay | Admitting: Licensed Clinical Social Worker

## 2021-03-17 ENCOUNTER — Other Ambulatory Visit: Payer: Self-pay | Admitting: Internal Medicine

## 2021-03-17 ENCOUNTER — Other Ambulatory Visit: Payer: Self-pay | Admitting: Family Medicine

## 2021-03-17 DIAGNOSIS — R7303 Prediabetes: Secondary | ICD-10-CM

## 2021-03-17 DIAGNOSIS — E785 Hyperlipidemia, unspecified: Secondary | ICD-10-CM

## 2021-03-17 NOTE — Telephone Encounter (Signed)
Routing to Dr Juleen China office

## 2021-04-08 ENCOUNTER — Ambulatory Visit (INDEPENDENT_AMBULATORY_CARE_PROVIDER_SITE_OTHER): Payer: 59 | Admitting: Internal Medicine

## 2021-04-08 ENCOUNTER — Encounter: Payer: Self-pay | Admitting: Internal Medicine

## 2021-04-08 ENCOUNTER — Other Ambulatory Visit: Payer: Self-pay

## 2021-04-08 VITALS — BP 188/140 | HR 78 | Resp 16 | Wt 275.8 lb

## 2021-04-08 DIAGNOSIS — R7303 Prediabetes: Secondary | ICD-10-CM

## 2021-04-08 DIAGNOSIS — F319 Bipolar disorder, unspecified: Secondary | ICD-10-CM

## 2021-04-08 DIAGNOSIS — I10 Essential (primary) hypertension: Secondary | ICD-10-CM

## 2021-04-08 DIAGNOSIS — F411 Generalized anxiety disorder: Secondary | ICD-10-CM | POA: Diagnosis not present

## 2021-04-08 DIAGNOSIS — F410 Panic disorder [episodic paroxysmal anxiety] without agoraphobia: Secondary | ICD-10-CM | POA: Diagnosis not present

## 2021-04-08 LAB — POCT GLYCOSYLATED HEMOGLOBIN (HGB A1C): HbA1c, POC (prediabetic range): 5.4 % — AB (ref 5.7–6.4)

## 2021-04-08 LAB — GLUCOSE, POCT (MANUAL RESULT ENTRY): POC Glucose: 88 mg/dl (ref 70–99)

## 2021-04-08 MED ORDER — ALPRAZOLAM 0.25 MG PO TABS: 0.2500 mg | ORAL_TABLET | Freq: Two times a day (BID) | ORAL | 1 refills | Status: DC | PRN

## 2021-04-08 MED ORDER — AMLODIPINE BESYLATE 10 MG PO TABS: 10.0000 mg | ORAL_TABLET | Freq: Every day | ORAL | 5 refills | Status: DC

## 2021-04-08 NOTE — Progress Notes (Signed)
Subjective:    Nathan Bradford - 30 y.o. male MRN [id number]  Date of birth: 1991-09-28  HPI  Nathan Bradford is here for follow up.  Chronic HTN Disease Monitoring:  Home BP Monitoring - Does not monitor  Chest pain- no  Dyspnea- no Headache - no  Medications: Amlodipine 10 mg, Coreg 12.5 mg BID Compliance- no, has been out of Amlodipine for several months   Lightheadedness- no  Edema- no   Health Maintenance:  Health Maintenance Due  Topic Date Due  . Hepatitis C Screening  Never done  . COVID-19 Vaccine (1) Never done    -  reports that he has never smoked. He has never used smokeless tobacco. - Review of Systems: Per HPI. - Past Medical History: Patient Active Problem List   Diagnosis Date Noted  . Bipolar disorder ([place]) 04/08/2021  . Essential hypertension 12/30/2018  . BMI 40.0-44.9, adult ([place]) 12/30/2018  . Generalized anxiety disorder 04/06/2015  . Panic disorder 04/06/2015   - Medications: reviewed and updated   Objective:   Physical Exam BP (!) 188/140   Pulse 78   Resp 16   Wt 275 lb 12.8 oz (125.1 kg)   SpO2 98%   BMI 38.47 kg/m  Physical Exam Constitutional:      General: He is not in acute distress.    Appearance: He is not diaphoretic.  HENT:     Head: Normocephalic and atraumatic.  Eyes:     Conjunctiva/sclera: Conjunctivae normal.  Cardiovascular:     Rate and Rhythm: Normal rate and regular rhythm.     Heart sounds: Normal heart sounds. No murmur heard.   Pulmonary:     Effort: Pulmonary effort is normal. No respiratory distress.     Breath sounds: Normal breath sounds.  Musculoskeletal:        General: Normal range of motion.  Skin:    General: Skin is warm and dry.  Neurological:     Mental Status: He is alert and oriented to person, place, and time.  Psychiatric:        Mood and Affect: Affect normal.        Judgment: Judgment normal.            Assessment & Plan:   1. Prediabetes A1c 5.4,  consistent with prior diagnosis of prediabetes. Discussed carb modified diet and exercise.  - POCT glucose (manual entry) - POCT glycosylated hemoglobin (Hb A1C)  2. Essential hypertension BP significantly above goal, likely due to lack of appropriate medication regimen. Refill provided. Patient asymptomatic and stable for outpatient f/u. Strict return precautions discussed.  Counseled on blood pressure goal of less than 130/80, low-sodium, DASH diet, medication compliance, 150 minutes of moderate intensity exercise per week. Discussed medication compliance, adverse effects. - amLODipine (NORVASC) 10 MG tablet; Take 1 tablet (10 mg total) by mouth daily.  Dispense: 30 tablet; Refill: 5  3. Panic disorder - Ambulatory referral to Psychiatry  4. Generalized anxiety disorder Discussed with patient that given complexity of his psychiatric history with GAD and Bipolar, will need assistance from psych for management. He has been having multiple panic attacks. Will refill Xanax in interim but discussed this clinic typically does not write for benzos long term.  - ALPRAZolam (XANAX) 0.25 MG tablet; Take 1 tablet (0.25 mg total) by mouth 2 (two) times daily as needed for anxiety.  Dispense: 60 tablet; Refill: 1 - Ambulatory referral to Psychiatry  5. Bipolar affective disorder, remission status unspecified ([place]) -  Ambulatory referral to Psychiatry     Phill Myron, D.O. 04/15/2021, 10:50 AM Primary Care at [facility]

## 2021-04-08 NOTE — Patient Instructions (Addendum)
Behavioral Health Resources:   What if I or someone I know is in crisis?  . If you are thinking about harming yourself or having thoughts of suicide, or if you know someone who is, seek help right away.  . Call your doctor or mental health care provider.  . Call 911 or go to a hospital emergency room to get immediate help, or ask a friend or family member to help you do these things; IF YOU ARE IN [place], YOU MAY GO TO WALK-IN URGENT CARE 24/7 at [facility] (see below)  . Call the [place] National Suicide Prevention Lifeline's toll-free, 24-hour hotline at 1-800-273-TALK (913)751-9539) or TTY: 1-800-799-4 TTY (681) 649-6760) to talk to a trained counselor.  . If you are in crisis, make sure you are not left alone.   . If someone else is in crisis, make sure he or she is not left alone   24 Hour :   [facility]  [address] [place], [place] [address] [phone] or [place] 24/7  [facility] Mobile Crisis: [phone]  [place] National Suicide Hotline: 772-385-9177  [facility] the [employer] (Domestic Violence, Rape & Victim Assistance)  (848)656-8179  [place]  [address], [place]  [address]   (313)209-3544 or (724) [phone]   [place]: [phone] (8am-4pm) or [phone] (after hours)        [facility], [address], [place], [place] Fax: [phone] [email or web address] *Interpreters available *Accepts all insurance and uninsured for Urgent Care needs *Accepts Medicaid and uninsured for outpatient treatment   [facility]   Mon-Fri: 8am-5pm [place], [place], [place]); [phone]) BloggerCourse.com  *Accepts Medicare  [facility] [address], Fri: 8am-4pm [place], [place], [place] [address] [phone] (phone); [phone] (fax) TaskTown.es  *[place] Mon-Fri: 9am-5pm  [address] [place], [place] (phone); [phone]  [email or web address]  *Accepts Medicaid  [facility] [address] Johnette Abraham [place], [place] SalonLookup.es   [facility] of the [place], 8:30am-12pm/1pm-2:30pm [address], [place], [place] (phone); [phone] (fax) www.[email or web address]  *Accepts Medicaid, sliding-scale*Bilingual services available  [facility] Mon-Fri, 8am-7pm [place], [place]  [phone](phone); [phone]) www.famsolutions.org  *Accepts Medicaid *Bilingual services available  [facility] Mon-Fri: 8am-5pm, Saturday by appointment only [place], [place], [place] (phone); [phone] (fax) [email or web address]   [facility] [address], [place], [place], [place] [email or web address]  *Free & reduced services for uninsured and underinsured individuals *Bilingual services for Spanish-speaking clients 21 and under  [facility], [address] [place], [place]); [phone]) RunningConvention.de  *Bring your own interpreter at first visit *Accepts Medicare and [facility]  [place] Mon-Fri: 9am-5:30pm [address], [place], [place], [place] (phone), [phone] (fax) After hours crisis line: [phone] www.[email or web address]  *Accepts Medicare and Medicaid  [facility], 8am-6pm [address] [place], [place] (phone);  [phone] (fax) http://[email or web address]  *Subsidized costs available  [facility] Mon-Fri: 8am-4pm [address], [place], [place] [phone](phone); [phone]) www.psychotherapeuticservices.com  *Accepts Medicaid  [facility] Same day access hours: Mon-Fri, 8:30-3pm Crisis hours: Mon-Fri, 8am-5pm [address] [place], [place] [phone]  [facility] [employer] Same day access hours: Mon-Fri,  8:30-3pm Crisis hours: Mon-Fri, 8am-8pm [address] [place], [place] (phone); [phone] (fax) www.[email or web address]  *Accepts Medicaid and Medicare  [facility] Mon, [place], Fri: 9am-9pm Tues, Thurs: 9am-6pm [place], [place], [place] (phone); [phone] (fax) https://[email or web address]  *(Accepts Medicare and Medicaid; payment plans available)*Bilingual services available  [facility] [address] [place], [place] (phone); [phone] (fax) www.[email or web address]   [facility] [address], [place], [place], [place]  [email or web address]  *Bilingual services available  [facility]Social [facility]otional [facility]) Mon-Thurs: 8am-8pm [address] [place], [place], [place] (phone); [phone] (fax) [email or web address]  *Accepts Medicaid*Bilingual services available  [facility] [place]. [place], [place] (phone) DeadConnect.com.[email or web address]  *Accepts Medicaid *Bilingual services available  [facility] Mon-Fri, 9am-4:45pm [address], [place], [place] (phone); [phone] (fax) [email or web address]://[email or web address]  *[facility] Mon-Thurs: 8:30-8pm, Fri: 8:30am-7pm [address], [place], [place] (3rd floor) [phone] (phone); [phone]  (fax) VIPinterview.[email or web address]  *Accepts Medicaid; income-based reduced rates available  [facility] Mon-Fri: 8am-5pm [address], [place], [place] [address] [phone] (phone); [phone] (fax) [email or web address]://[email or web address]  *Accepts Medicaid*Bilingual services available   [facility] ([place])  [address] [place] [phone] www.[email or web address]  *Provides direct services to individuals in recovery from mental illness, including support groups, recovery skills classes, and one on one peer support  NAMI [employer] on [place]) [place] helpline: 4456626980  NAMI [place] helpline: [phone] https://namiguilford.org  *A community hub for information relating to local resources and services for the friends and families of individuals living alongside a mental health condition, as well as the individuals themselves. Classes and support groups also provided

## 2021-04-14 ENCOUNTER — Other Ambulatory Visit: Payer: Self-pay

## 2021-04-15 ENCOUNTER — Ambulatory Visit (INDEPENDENT_AMBULATORY_CARE_PROVIDER_SITE_OTHER): Payer: 59 | Admitting: Emergency Medicine

## 2021-04-15 ENCOUNTER — Encounter: Payer: Self-pay | Admitting: Emergency Medicine

## 2021-04-15 ENCOUNTER — Other Ambulatory Visit: Payer: Self-pay

## 2021-04-15 VITALS — BP 160/120 | HR 102 | Temp 98.5°F | Ht 72.0 in | Wt 282.0 lb

## 2021-04-15 DIAGNOSIS — Z7689 Persons encountering health services in other specified circumstances: Secondary | ICD-10-CM

## 2021-04-15 DIAGNOSIS — I10 Essential (primary) hypertension: Secondary | ICD-10-CM | POA: Diagnosis not present

## 2021-04-15 DIAGNOSIS — Z6841 Body Mass Index (BMI) 40.0 and over, adult: Secondary | ICD-10-CM

## 2021-04-15 DIAGNOSIS — F411 Generalized anxiety disorder: Secondary | ICD-10-CM | POA: Diagnosis not present

## 2021-04-15 DIAGNOSIS — F319 Bipolar disorder, unspecified: Secondary | ICD-10-CM | POA: Diagnosis not present

## 2021-04-15 MED ORDER — AMLODIPINE BESYLATE 10 MG PO TABS: 10.0000 mg | ORAL_TABLET | Freq: Every day | ORAL | 3 refills | Status: DC

## 2021-04-15 NOTE — Patient Instructions (Signed)

## 2021-04-15 NOTE — Assessment & Plan Note (Signed)
Diet and nutrition discussed.  Needs to increase amount of daily physical activity and decrease amount of daily carbohydrates.

## 2021-04-15 NOTE — Assessment & Plan Note (Signed)
Uncontrolled hypertension.  Needs to start amlodipine 10 mg daily. Diet and nutrition discussed. Follow-up in 4 to 8 weeks.

## 2021-04-15 NOTE — Progress Notes (Signed)
Assessment & Plan:    1. Prediabetes A1c 5.4, consistent with prior diagnosis of prediabetes. Discussed carb modified diet and exercise.  - POCT glucose (manual entry) - POCT glycosylated hemoglobin (Hb A1C)   2. Essential hypertension BP significantly above goal, likely due to lack of appropriate medication regimen. Refill provided. Patient asymptomatic and stable for outpatient f/u. Strict return precautions discussed.  Counseled on blood pressure goal of less than 130/80, low-sodium, DASH diet, medication compliance, 150 minutes of moderate intensity exercise per week. Discussed medication compliance, adverse effects. - amLODipine (NORVASC) 10 MG tablet; Take 1 tablet (10 mg total) by mouth daily.  Dispense: 30 tablet; Refill: 5   3. Panic disorder - Ambulatory referral to Psychiatry   4. Generalized anxiety disorder Discussed with patient that given complexity of his psychiatric history with GAD and Bipolar, will need assistance from psych for management. He has been having multiple panic attacks. Will refill Xanax in interim but discussed this clinic typically does not write for benzos long term.  - ALPRAZolam (XANAX) 0.25 MG tablet; Take 1 tablet (0.25 mg total) by mouth 2 (two) times daily as needed for anxiety.  Dispense: 60 tablet; Refill: 1 - Ambulatory referral to Psychiatry   5. Bipolar affective disorder, remission status unspecified ([place]) - Ambulatory referral to Psychiatry         Phill Myron, D.O. 04/15/2021, 10:50 AM Primary Care at [facility] 30 y.o.   Chief Complaint  Patient presents with  . Establish Care    Discuss sugar drops and dehydrated    HISTORY OF PRESENT ILLNESS: This is a 30 y.o. male first visit to this office here to establish care with me. Was seen recently by Dr. Juleen China on 04/08/2021.  Office visit notes reviewed. Patient has the following chronic medical problems as listed on her note: 1.  Prediabetes with a recent  A1c of 5.4 2.  Hypertension.  Prescribed amlodipine 10 mg but not started yet. 3.  History of generalized anxiety disorder panic disorder.  Was referred to psychiatry but nothing scheduled yet. On no medications.  Was prescribed Xanax to be taken as needed but he is not taking it. 4.  History of bipolar disorder.  No recent psychiatric evaluation or medications. No other complaints or medical concerns today.  HPI   Prior to Admission medications   Medication Sig Start Date End Date Taking? Authorizing Provider  albuterol (VENTOLIN HFA) 108 (90 Base) MCG/ACT inhaler Inhale 2 puffs into the lungs every 6 (six) hours as needed for wheezing or shortness of breath. 09/23/20  Yes Nicolette Bang, DO  ALPRAZolam Duanne Moron) 0.25 MG tablet Take 1 tablet (0.25 mg total) by mouth 2 (two) times daily as needed for anxiety. 04/08/21  Yes Nicolette Bang, DO  metFORMIN (GLUCOPHAGE) 500 MG tablet Take 1 tablet by mouth once daily with breakfast 03/18/21  Yes Nicolette Bang, DO  amLODipine (NORVASC) 10 MG tablet Take 1 tablet (10 mg total) by mouth daily. Patient not taking: Reported on 04/15/2021 04/08/21   Nicolette Bang, DO  atorvastatin (LIPITOR) 20 MG tablet Take 1 tablet (20 mg total) by mouth daily. Patient not taking: Reported on 04/15/2021 09/23/20   Nicolette Bang, DO  benzonatate (TESSALON) 100 MG capsule Take 1-2 capsules (100-200 mg total) by mouth 3 (three) times daily as needed for cough. Patient not taking: Reported on 04/15/2021 09/23/20   Nicolette Bang, DO  busPIRone (BUSPAR) 7.5 MG tablet Take 1 tablet (7.5 mg  total) by mouth 2 (two) times daily. Patient not taking: Reported on 04/15/2021 10/21/20   Nicolette Bang, DO  carvedilol (COREG) 12.5 MG tablet Take 1 tablet (12.5 mg total) by mouth 2 (two) times daily with a meal. Patient not taking: Reported on 04/15/2021 09/23/20   Nicolette Bang, DO  loratadine (CLARITIN) 10  MG tablet Take 1 tablet (10 mg total) by mouth daily. Patient not taking: Reported on 04/15/2021 09/23/20   Nicolette Bang, DO    Allergies  Allergen Reactions  . Chlorthalidone   . Lisinopril Cough    Patient Active Problem List   Diagnosis Date Noted  . Bipolar disorder ([place]) 04/08/2021  . Essential hypertension 12/30/2018  . BMI 40.0-44.9, adult ([place]) 12/30/2018  . Generalized anxiety disorder 04/06/2015  . Panic disorder 04/06/2015    Past Medical History:  Diagnosis Date  . Anxiety   . Asthma    Phreesia 09/23/2020  . Bipolar 1 disorder ([place])   . Depression   . Depression    Phreesia 09/23/2020  . Diabetes mellitus without complication ([place])    Phreesia 09/23/2020  . Hypertension    Phreesia 09/23/2020  . Vertigo     Past Surgical History:  Procedure Laterality Date  . WISDOM TOOTH EXTRACTION      Social History   Socioeconomic History  . Marital status: Single    Spouse name: Not on file  . Number of children: Not on file  . Years of education: Not on file  . Highest education level: Not on file  Occupational History  . Not on file  Tobacco Use  . Smoking status: Never Smoker  . Smokeless tobacco: Never Used  Vaping Use  . Vaping Use: Never used  Substance and Sexual Activity  . Alcohol use: No  . Drug use: No  . Sexual activity: Not on file  Other Topics Concern  . Not on file  Social History Narrative  . Not on file   Social Determinants of Health   Financial Resource Strain: Not on file  Food Insecurity: Not on file  Transportation Needs: Not on file  Physical Activity: Not on file  Stress: Not on file  Social Connections: Not on file  Intimate Partner Violence: Not on file    Family History  Problem Relation Age of Onset  . Hypertension Mother   . Hypertension Father      Review of Systems  Constitutional: Negative.  Negative for chills and fever.  HENT: Negative.  Negative for congestion and sore throat.    Respiratory: Negative.  Negative for cough and shortness of breath.   Cardiovascular: Negative.  Negative for chest pain and palpitations.  Gastrointestinal: Negative for abdominal pain, diarrhea, nausea and vomiting.  Genitourinary: Negative.  Negative for dysuria and hematuria.  Skin: Negative.  Negative for rash.  Neurological: Negative.  Negative for dizziness and headaches.  All other systems reviewed and are negative.    Physical Exam Vitals reviewed.  Constitutional:      Appearance: Normal appearance.  HENT:     Head: Normocephalic.  Eyes:     Extraocular Movements: Extraocular movements intact.     Conjunctiva/sclera: Conjunctivae normal.     Pupils: Pupils are equal, round, and reactive to light.  Cardiovascular:     Rate and Rhythm: Normal rate and regular rhythm.     Pulses: Normal pulses.     Heart sounds: Normal heart sounds.  Pulmonary:     Effort: Pulmonary effort is normal.  Breath sounds: Normal breath sounds.  Musculoskeletal:     Cervical back: Normal range of motion and neck supple.  Neurological:     General: No focal deficit present.     Mental Status: He is alert and oriented to person, place, and time.  Psychiatric:        Mood and Affect: Mood normal.        Behavior: Behavior normal.      ASSESSMENT & PLAN: A total of 45 minutes was spent with the patient and counseling/coordination of care regarding establishing care with me, hypertension and cardiovascular risks associated with this condition, need to start amlodipine 10 mg daily, education on nutrition, need for psychiatric evaluation of bipolar disorder and possible treatment, review of most recent office visit note with Dr. Juleen China, review of most recent blood work results, importance of prediabetes and how to treat it, documentation, prognosis, and need for follow-up.  Essential hypertension Uncontrolled hypertension.  Needs to start amlodipine 10 mg daily. Diet and nutrition  discussed. Follow-up in 4 to 8 weeks.  Bipolar disorder ([place]) Presently on no treatment.  No recent psychiatric evaluation.  Psychiatry referral placed today.  BMI 40.0-44.9, adult ([place]) Diet and nutrition discussed.  Needs to increase amount of daily physical activity and decrease amount of daily carbohydrates.  Nikolay was seen today for establish care.  Diagnoses and all orders for this visit:  Uncontrolled hypertension  Essential hypertension -     amLODipine (NORVASC) 10 MG tablet; Take 1 tablet (10 mg total) by mouth daily.  Bipolar affective disorder, remission status unspecified ([place]) -     Ambulatory referral to Psychiatry  Generalized anxiety disorder -     Ambulatory referral to Psychiatry  BMI 40.0-44.9, adult ([place])  Encounter to establish care    Patient Instructions   Hypertension, Adult High blood pressure (hypertension) is when the force of blood pumping through the arteries is too strong. The arteries are the blood vessels that carry blood from the heart throughout the body. Hypertension forces the heart to work harder to pump blood and may cause arteries to become narrow or stiff. Untreated or uncontrolled hypertension can cause a heart attack, heart failure, a stroke, kidney disease, and other problems. A blood pressure reading consists of a higher number over a lower number. Ideally, your blood pressure should be below 120/80. The first ("top") number is called the systolic pressure. It is a measure of the pressure in your arteries as your heart beats. The second ("bottom") number is called the diastolic pressure. It is a measure of the pressure in your arteries as the heart relaxes. What are the causes? The exact cause of this condition is not known. There are some conditions that result in or are related to high blood pressure. What increases the risk? Some risk factors for high blood pressure are under your control. The following factors may make you more  likely to develop this condition:  Smoking.  Having type 2 diabetes mellitus, high cholesterol, or both.  Not getting enough exercise or physical activity.  Being overweight.  Having too much fat, sugar, calories, or salt (sodium) in your diet.  Drinking too much alcohol. Some risk factors for high blood pressure may be difficult or impossible to change. Some of these factors include:  Having chronic kidney disease.  Having a family history of high blood pressure.  Age. Risk increases with age.  Race. You may be at higher risk if you are African American.  Gender. Men  are at higher risk than women before age 48. After age 31, women are at higher risk than men.  Having obstructive sleep apnea.  Stress. What are the signs or symptoms? High blood pressure may not cause symptoms. Very high blood pressure (hypertensive crisis) may cause:  Headache.  Anxiety.  Shortness of breath.  Nosebleed.  Nausea and vomiting.  Vision changes.  Severe chest pain.  Seizures. How is this diagnosed? This condition is diagnosed by measuring your blood pressure while you are seated, with your arm resting on a flat surface, your legs uncrossed, and your feet flat on the floor. The cuff of the blood pressure monitor will be placed directly against the skin of your upper arm at the level of your heart. It should be measured at least twice using the same arm. Certain conditions can cause a difference in blood pressure between your right and left arms. Certain factors can cause blood pressure readings to be lower or higher than normal for a short period of time:  When your blood pressure is higher when you are in a health care provider's office than when you are at home, this is called white coat hypertension. Most people with this condition do not need medicines.  When your blood pressure is higher at home than when you are in a health care provider's office, this is called masked  hypertension. Most people with this condition may need medicines to control blood pressure. If you have a high blood pressure reading during one visit or you have normal blood pressure with other risk factors, you may be asked to:  Return on a different day to have your blood pressure checked again.  Monitor your blood pressure at home for 1 week or longer. If you are diagnosed with hypertension, you may have other blood or imaging tests to help your health care provider understand your overall risk for other conditions. How is this treated? This condition is treated by making healthy lifestyle changes, such as eating healthy foods, exercising more, and reducing your alcohol intake. Your health care provider may prescribe medicine if lifestyle changes are not enough to get your blood pressure under control, and if:  Your systolic blood pressure is above 130.  Your diastolic blood pressure is above 80. Your personal target blood pressure may vary depending on your medical conditions, your age, and other factors. Follow these instructions at home: Eating and drinking  Eat a diet that is high in fiber and potassium, and low in sodium, added sugar, and fat. An example eating plan is called the DASH (Dietary Approaches to Stop Hypertension) diet. To eat this way: ? Eat plenty of fresh fruits and vegetables. Try to fill one half of your plate at each meal with fruits and vegetables. ? Eat whole grains, such as whole-wheat pasta, brown rice, or whole-grain bread. Fill about one fourth of your plate with whole grains. ? Eat or drink low-fat dairy products, such as skim milk or low-fat yogurt. ? Avoid fatty cuts of meat, processed or cured meats, and poultry with skin. Fill about one fourth of your plate with lean proteins, such as fish, chicken without skin, beans, eggs, or tofu. ? Avoid pre-made and processed foods. These tend to be higher in sodium, added sugar, and fat.  Reduce your daily sodium  intake. Most people with hypertension should eat less than 1,500 mg of sodium a day.  Do not drink alcohol if: ? Your health care provider tells you not to drink. ?  You are pregnant, may be pregnant, or are planning to become pregnant.  If you drink alcohol: ? Limit how much you use to:  0-1 drink a day for women.  0-2 drinks a day for men. ? Be aware of how much alcohol is in your drink. In the [place] one drink equals one 12 oz bottle of beer (355 mL), one 5 oz glass of wine (148 mL), or one 1 oz glass of hard liquor (44 mL).   Lifestyle  Work with your health care provider to maintain a healthy body weight or to lose weight. Ask what an ideal weight is for you.  Get at least 30 minutes of exercise most days of the week. Activities may include walking, swimming, or biking.  Include exercise to strengthen your muscles (resistance exercise), such as Pilates or lifting weights, as part of your weekly exercise routine. Try to do these types of exercises for 30 minutes at least 3 days a week.  Do not use any products that contain nicotine or tobacco, such as cigarettes, e-cigarettes, and chewing tobacco. If you need help quitting, ask your health care provider.  Monitor your blood pressure at home as told by your health care provider.  Keep all follow-up visits as told by your health care provider. This is important.   Medicines  Take over-the-counter and prescription medicines only as told by your health care provider. Follow directions carefully. Blood pressure medicines must be taken as prescribed.  Do not skip doses of blood pressure medicine. Doing this puts you at risk for problems and can make the medicine less effective.  Ask your health care provider about side effects or reactions to medicines that you should watch for. Contact a health care provider if you:  Think you are having a reaction to a medicine you are taking.  Have headaches that keep coming back  (recurring).  Feel dizzy.  Have swelling in your ankles.  Have trouble with your vision. Get help right away if you:  Develop a severe headache or confusion.  Have unusual weakness or numbness.  Feel faint.  Have severe pain in your chest or abdomen.  Vomit repeatedly.  Have trouble breathing. Summary  Hypertension is when the force of blood pumping through your arteries is too strong. If this condition is not controlled, it may put you at risk for serious complications.  Your personal target blood pressure may vary depending on your medical conditions, your age, and other factors. For most people, a normal blood pressure is less than 120/80.  Hypertension is treated with lifestyle changes, medicines, or a combination of both. Lifestyle changes include losing weight, eating a healthy, low-sodium diet, exercising more, and limiting alcohol. This information is not intended to replace advice given to you by your health care provider. Make sure you discuss any questions you have with your health care provider. Document Revised: 08/09/2018 Document Reviewed: 08/09/2018 Elsevier Patient Education  2021 [place], MD [facility]

## 2021-04-15 NOTE — Assessment & Plan Note (Signed)
Presently on no treatment.  No recent psychiatric evaluation.  Psychiatry referral placed today.

## 2021-05-29 ENCOUNTER — Ambulatory Visit (INDEPENDENT_AMBULATORY_CARE_PROVIDER_SITE_OTHER): Payer: 59 | Admitting: Internal Medicine

## 2021-05-29 ENCOUNTER — Encounter: Payer: Self-pay | Admitting: Internal Medicine

## 2021-05-29 ENCOUNTER — Other Ambulatory Visit: Payer: Self-pay

## 2021-05-29 VITALS — BP 160/120 | HR 86 | Temp 99.0°F | Ht 72.0 in | Wt 276.0 lb

## 2021-05-29 DIAGNOSIS — I10 Essential (primary) hypertension: Secondary | ICD-10-CM

## 2021-05-29 DIAGNOSIS — R7303 Prediabetes: Secondary | ICD-10-CM

## 2021-05-29 DIAGNOSIS — K219 Gastro-esophageal reflux disease without esophagitis: Secondary | ICD-10-CM | POA: Diagnosis not present

## 2021-05-29 LAB — COMPREHENSIVE METABOLIC PANEL
ALT: 13 U/L (ref 0–53)
AST: 16 U/L (ref 0–37)
Albumin: 4.4 g/dL (ref 3.5–5.2)
Alkaline Phosphatase: 66 U/L (ref 39–117)
BUN: 10 mg/dL (ref 6–23)
CO2: 31 mEq/L (ref 19–32)
Calcium: 9.1 mg/dL (ref 8.4–10.5)
Chloride: 103 mEq/L (ref 96–112)
Creatinine, Ser: 1.33 mg/dL (ref 0.40–1.50)
GFR: 72.29 mL/min (ref >60.00)
Glucose, Bld: 79 mg/dL (ref 70–99)
Potassium: 3.2 mEq/L — ABNORMAL LOW (ref 3.5–5.1)
Sodium: 140 mEq/L (ref 135–145)
Total Bilirubin: 1 mg/dL (ref 0.2–1.2)
Total Protein: 7.4 g/dL (ref 6.0–8.3)

## 2021-05-29 LAB — CBC
HCT: 40.7 % (ref 39.0–52.0)
Hemoglobin: 13.8 g/dL (ref 13.0–17.0)
MCHC: 33.9 g/dL (ref 30.0–36.0)
MCV: 86.6 fl (ref 78.0–100.0)
Platelets: 329 10*3/uL (ref 150.0–400.0)
RBC: 4.7 Mil/uL (ref 4.22–5.81)
RDW: 14.6 % (ref 11.5–15.5)
WBC: 5 10*3/uL (ref 4.0–10.5)

## 2021-05-29 LAB — LIPID PANEL
Cholesterol: 175 mg/dL (ref 0–200)
HDL: 36.9 mg/dL — ABNORMAL LOW (ref >39.00)
LDL Cholesterol: 107 mg/dL — ABNORMAL HIGH (ref 0–99)
NonHDL: 138.52
Total CHOL/HDL Ratio: 5
Triglycerides: 160 mg/dL — ABNORMAL HIGH (ref 0.0–149.0)
VLDL: 32 mg/dL (ref 0.0–40.0)

## 2021-05-29 MED ORDER — PANTOPRAZOLE SODIUM 40 MG PO TBEC: 40.0000 mg | DELAYED_RELEASE_TABLET | Freq: Every day | ORAL | 3 refills | Status: DC

## 2021-05-29 MED ORDER — INDAPAMIDE 2.5 MG PO TABS: 2.5000 mg | ORAL_TABLET | Freq: Every day | ORAL | 1 refills | Status: DC

## 2021-05-29 NOTE — Assessment & Plan Note (Signed)
Unclear why he is still on metformin so will stop given side effects. Last HgA1c 5.4. Would need monitoring 3 months HgA1c.

## 2021-05-29 NOTE — Patient Instructions (Addendum)
We will stop the metformin.   We will start you on indapamide to take for blood pressure 1 pill daily. For now keep taking amlodipine and coreg as well.   Come back in 1-2 weeks.   We have sent in medicine for the stomach called protonix to take 1 pill daily.

## 2021-05-29 NOTE — Assessment & Plan Note (Signed)
Rx protonix to help. Some of the stomach symptoms may be coming from severely high blood pressure which we are also addressing.

## 2021-05-29 NOTE — Progress Notes (Signed)
   Subjective:   Patient ID: Nathan Bradford, male    DOB: May 31, 1991, 30 y.o.   MRN: [id number]  HPI The patient is a 30 YO man coming in for ongoing problems with severely high BP (taking amlodipine and coreg without relief, some vision trouble, dizziness and headaches, home readings 170/150s at times, denies missing doses, significant family history of severe high BP and grandma on dialysis) and gerd (having problems with acid reflux, not on treatment for this) and pre-diabetes (taking metformin, feels that this is bothering his stomach, last HgA1c 5.4)  Review of Systems  Constitutional: Negative.   HENT: Negative.    Eyes:  Positive for visual disturbance.  Respiratory:  Negative for cough, chest tightness and shortness of breath.   Cardiovascular:  Negative for chest pain, palpitations and leg swelling.  Gastrointestinal:  Positive for abdominal pain and diarrhea. Negative for abdominal distention, constipation, nausea and vomiting.  Genitourinary:  Positive for enuresis.  Musculoskeletal: Negative.   Skin: Negative.   Neurological:  Positive for dizziness and headaches.  Psychiatric/Behavioral: Negative.     Objective:  Physical Exam Constitutional:      Appearance: He is well-developed. He is obese.  HENT:     Head: Normocephalic and atraumatic.  Cardiovascular:     Rate and Rhythm: Normal rate and regular rhythm.  Pulmonary:     Effort: Pulmonary effort is normal. No respiratory distress.     Breath sounds: Normal breath sounds. No wheezing or rales.  Abdominal:     General: Bowel sounds are normal. There is no distension.     Palpations: Abdomen is soft.     Tenderness: There is no abdominal tenderness. There is no rebound.  Musculoskeletal:     Cervical back: Normal range of motion.  Skin:    General: Skin is warm and dry.  Neurological:     Mental Status: He is alert and oriented to person, place, and time.     Coordination: Coordination normal.  Psychiatric:      Comments: Somewhat anxious but appropriate    Vitals:   05/29/21 1407  BP: (!) 160/120  Pulse: 86  Temp: 99 F (37.2 C)  TempSrc: Oral  SpO2: 99%  Weight: 276 lb (125.2 kg)  Height: 6' (1.829 m)    This visit occurred during the SARS-CoV-2 public health emergency.  Safety protocols were in place, including screening questions prior to the visit, additional usage of staff PPE, and extensive cleaning of exam room while observing appropriate contact time as indicated for disinfecting solutions.   Assessment & Plan:

## 2021-05-29 NOTE — Assessment & Plan Note (Signed)
Concerning especially given family history of CKD with dialysis. BP is severely elevated. Will start indapamide 2.5 mg daily and continue amlodipine 10 mg daily and coreg 12.5 mg BID. Back in 1-2 weeks for close follow up. Checking CMP for signs of damage. If not responsive needs secondary workup for evaluation.

## 2021-06-12 ENCOUNTER — Ambulatory Visit (INDEPENDENT_AMBULATORY_CARE_PROVIDER_SITE_OTHER): Payer: 59 | Admitting: Internal Medicine

## 2021-06-12 ENCOUNTER — Encounter: Payer: Self-pay | Admitting: Internal Medicine

## 2021-06-12 ENCOUNTER — Other Ambulatory Visit: Payer: Self-pay

## 2021-06-12 ENCOUNTER — Other Ambulatory Visit: Payer: Self-pay | Admitting: Internal Medicine

## 2021-06-12 VITALS — BP 132/82 | HR 74 | Temp 98.2°F | Resp 18 | Ht 72.0 in | Wt 282.6 lb

## 2021-06-12 DIAGNOSIS — I10 Essential (primary) hypertension: Secondary | ICD-10-CM

## 2021-06-12 LAB — BASIC METABOLIC PANEL
BUN: 17 mg/dL (ref 6–23)
CO2: 32 mEq/L (ref 19–32)
Calcium: 9.2 mg/dL (ref 8.4–10.5)
Chloride: 101 mEq/L (ref 96–112)
Creatinine, Ser: 1.3 mg/dL (ref 0.40–1.50)
GFR: 74.28 mL/min (ref >60.00)
Glucose, Bld: 80 mg/dL (ref 70–99)
Potassium: 3 mEq/L — ABNORMAL LOW (ref 3.5–5.1)
Sodium: 142 mEq/L (ref 135–145)

## 2021-06-12 MED ORDER — POTASSIUM CHLORIDE CRYS ER 20 MEQ PO TBCR: 40.0000 meq | EXTENDED_RELEASE_TABLET | Freq: Every day | ORAL | 0 refills | Status: DC

## 2021-06-12 NOTE — Progress Notes (Signed)
   Subjective:   Patient ID: Nathan Bradford, male    DOB: February 24, 1991, 30 y.o.   MRN: [id number]  HPI The patient is a 30 YO man coming in for concerns about blood pressure. We did add indapamide last visit about 2 weeks ago. Got some headache after starting but this went away. Overall he feels better and no more stomach problems since getting blood pressure down. Denies headaches or chest pains. Working on health overall with getting active and cutting back on caffeine.   Review of Systems  Constitutional: Negative.   HENT: Negative.    Eyes: Negative.   Respiratory:  Negative for cough, chest tightness and shortness of breath.   Cardiovascular:  Negative for chest pain, palpitations and leg swelling.  Gastrointestinal:  Negative for abdominal distention, abdominal pain, constipation, diarrhea, nausea and vomiting.  Musculoskeletal: Negative.   Skin: Negative.   Neurological: Negative.   Psychiatric/Behavioral:  The patient is nervous/anxious.    Objective:  Physical Exam Constitutional:      Appearance: He is well-developed. He is obese.  HENT:     Head: Normocephalic and atraumatic.  Cardiovascular:     Rate and Rhythm: Normal rate and regular rhythm.  Pulmonary:     Effort: Pulmonary effort is normal. No respiratory distress.     Breath sounds: Normal breath sounds. No wheezing or rales.  Abdominal:     General: Bowel sounds are normal. There is no distension.     Palpations: Abdomen is soft.     Tenderness: There is no abdominal tenderness. There is no rebound.  Musculoskeletal:     Cervical back: Normal range of motion.  Skin:    General: Skin is warm and dry.  Neurological:     Mental Status: He is alert and oriented to person, place, and time.     Coordination: Coordination normal.    Vitals:   06/12/21 1430  BP: 132/82  Pulse: 74  Resp: 18  Temp: 98.2 F (36.8 C)  TempSrc: Oral  SpO2: 98%  Weight: 282 lb 9.6 oz (128.2 kg)  Height: 6' (1.829 m)    This  visit occurred during the SARS-CoV-2 public health emergency.  Safety protocols were in place, including screening questions prior to the visit, additional usage of staff PPE, and extensive cleaning of exam room while observing appropriate contact time as indicated for disinfecting solutions.   Assessment & Plan:

## 2021-06-12 NOTE — Patient Instructions (Signed)
You are doing great so keep up the great work!

## 2021-06-12 NOTE — Assessment & Plan Note (Signed)
States just taking indapamide and amlodipine at this time. Will clarify medication list to match as BP is at goal on this regimen today. Checking BMP with new medication changes and adjust as needed.

## 2021-06-17 ENCOUNTER — Encounter: Payer: Self-pay | Admitting: Internal Medicine

## 2021-06-17 ENCOUNTER — Ambulatory Visit: Payer: 59 | Admitting: Emergency Medicine

## 2021-06-17 ENCOUNTER — Other Ambulatory Visit: Payer: Self-pay

## 2021-07-18 IMAGING — CR DG CHEST 2V
2 series · 2 of 2 positions shown · non-contrast
Comparison: 10/24/2019

CLINICAL DATA: Chest pain

EXAM:
CHEST - 2 VIEW

[chest pa]
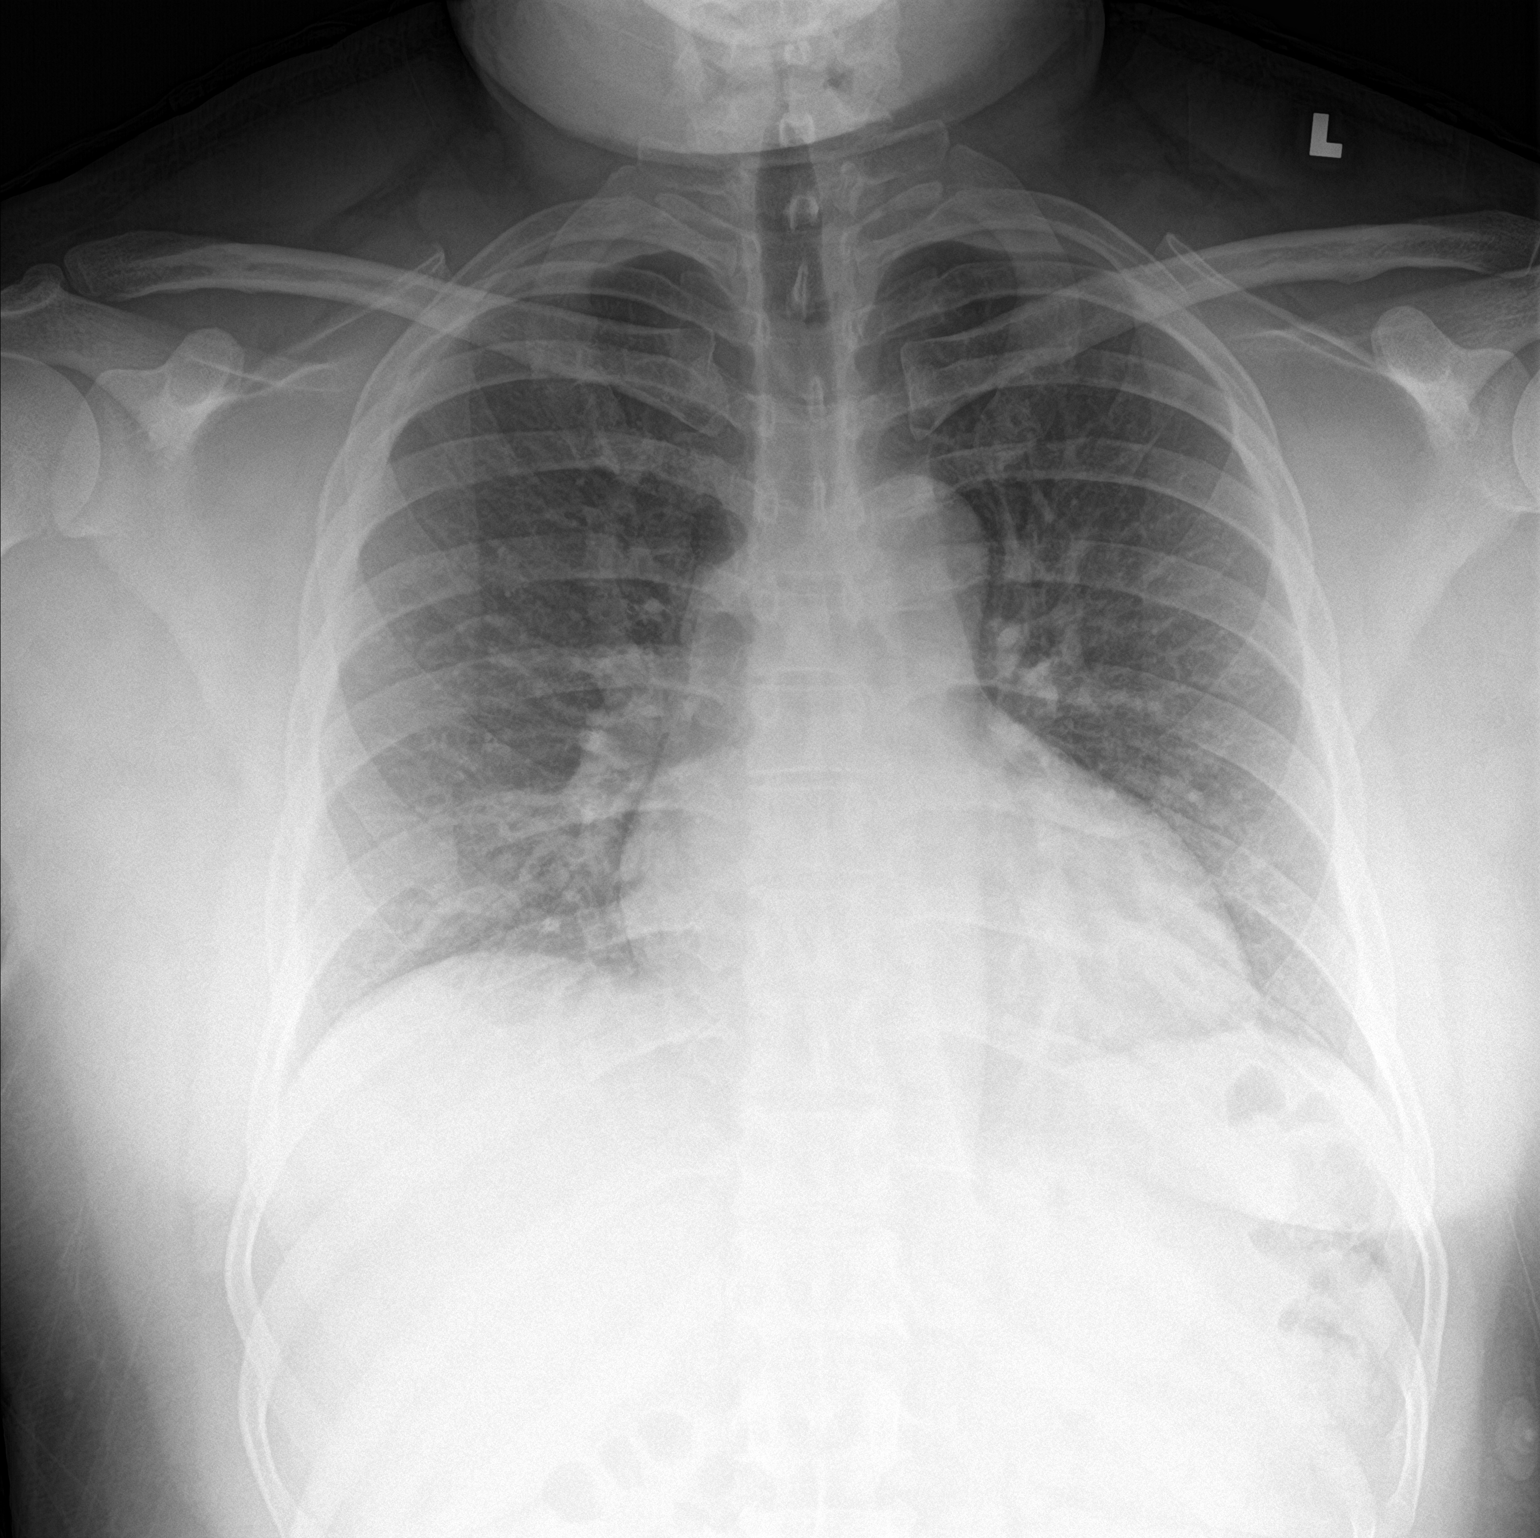

[chest lat]
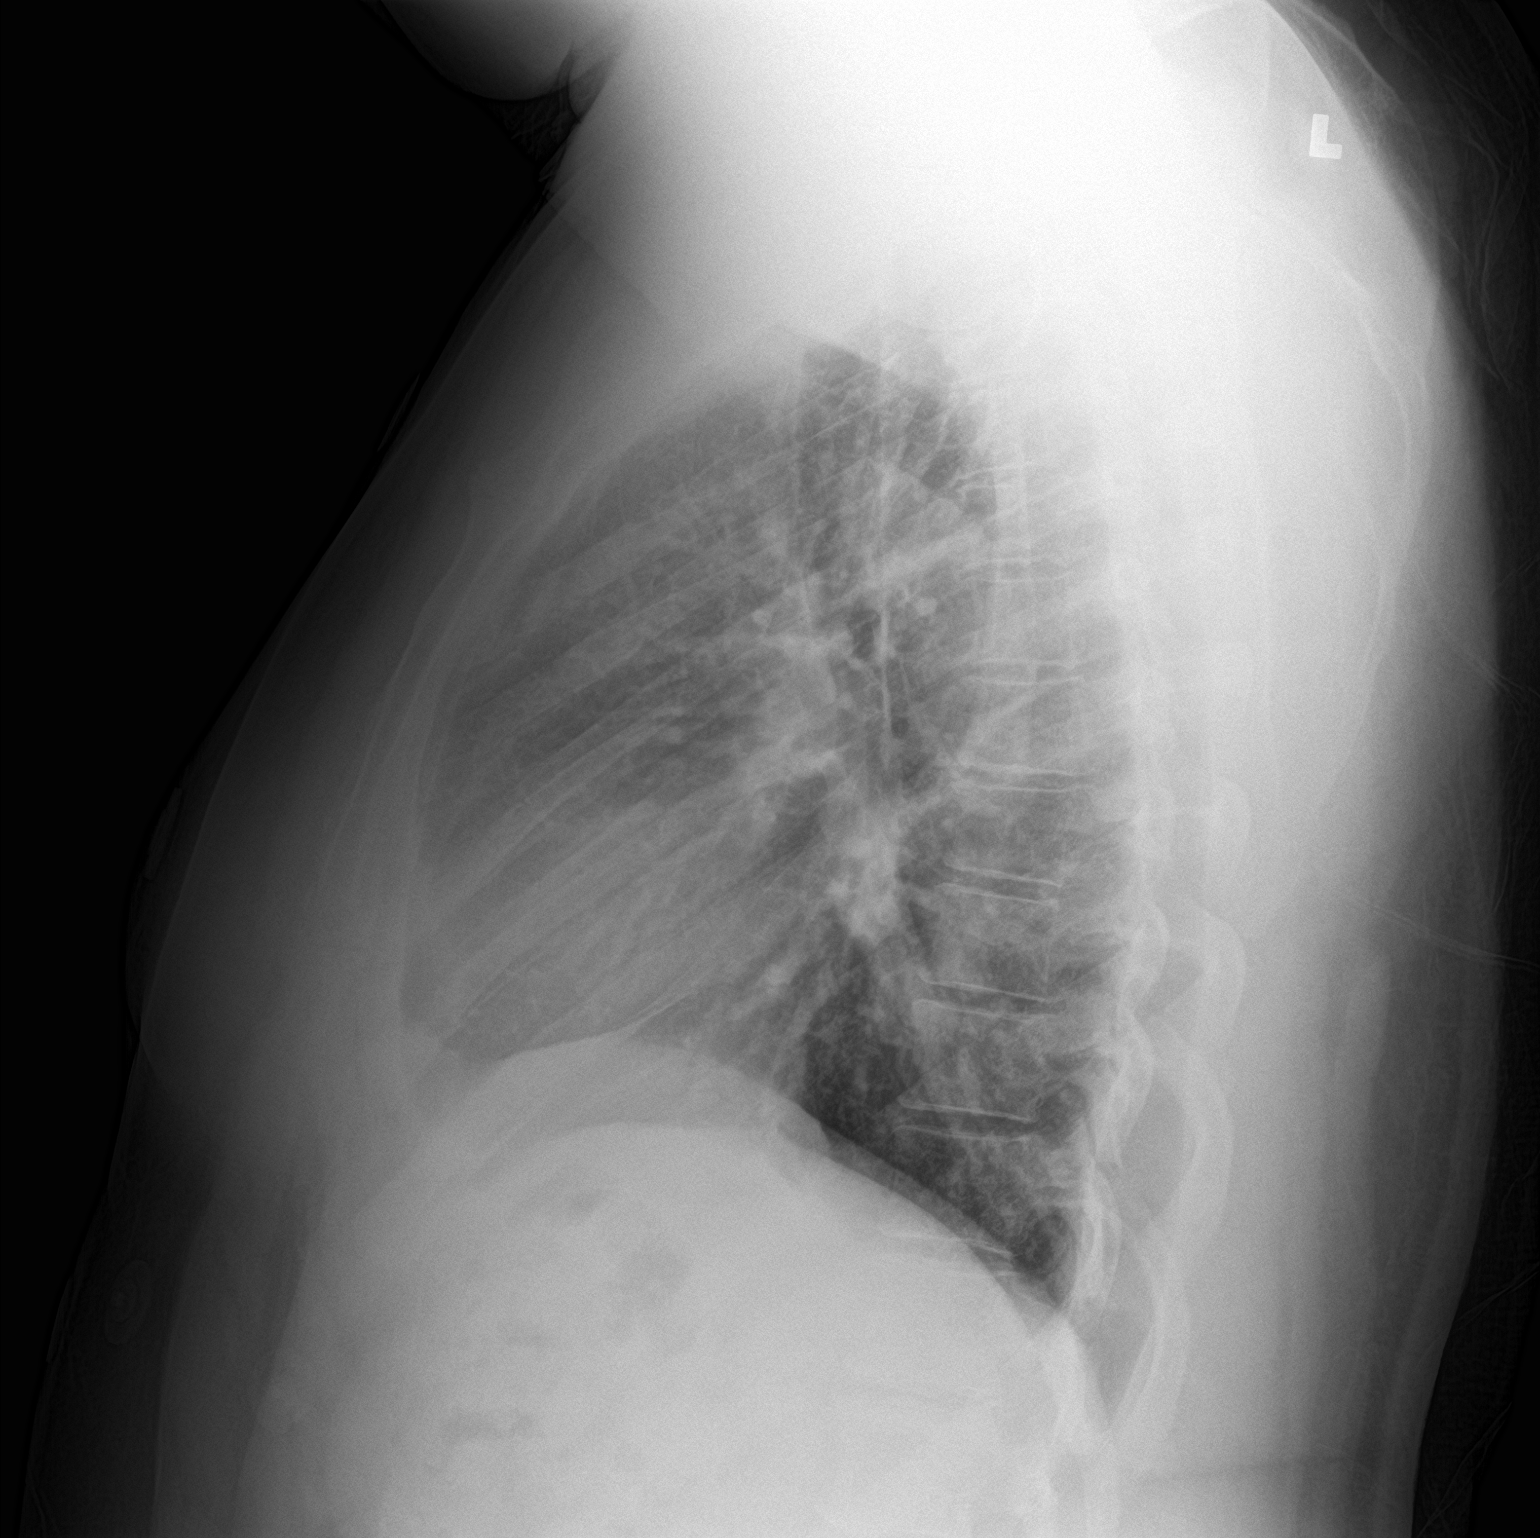

[2 of 2 positions shown; findings below may reference images not displayed]

FINDINGS: The heart size and mediastinal contours are within normal limits. No
focal airspace consolidation, pleural effusion, or pneumothorax. The
visualized skeletal structures are unremarkable.
IMPRESSION: No active cardiopulmonary disease.

## 2021-12-13 ENCOUNTER — Emergency Department (HOSPITAL_COMMUNITY)
Admission: EM | Admit: 2021-12-13 | Discharge: 2021-12-13 | Disposition: A | Discharge status: Home / Self Care | Payer: 59 | Attending: Emergency Medicine | Admitting: Emergency Medicine

## 2021-12-13 DIAGNOSIS — F419 Anxiety disorder, unspecified: Secondary | ICD-10-CM | POA: Diagnosis not present

## 2021-12-13 DIAGNOSIS — Z79899 Other long term (current) drug therapy: Secondary | ICD-10-CM | POA: Insufficient documentation

## 2021-12-13 MED ORDER — LORAZEPAM 1 MG PO TABS
0.5000 mg | ORAL_TABLET | Freq: Once | ORAL | Status: AC
  Administered 2021-12-13: 0.5 mg via ORAL
  Filled 2021-12-13: qty 1

## 2021-12-13 NOTE — Discharge Instructions (Signed)
Return for any problem.  ?

## 2021-12-13 NOTE — ED Triage Notes (Signed)
Pt bib [facility] c/o anxiety. Pt took a xanax before EMS arrived. He usually takes it for his anxiety. Pt has been under stress recently and his anxiety has been high. Pt also sat down and said he LOC. Denies falling or hitting his head. Pt A&Ox4 upon arrival.

## 2021-12-13 NOTE — ED Provider Notes (Signed)
[facility] EMERGENCY DEPARTMENT Provider Note   CSN: [id number] Arrival date & time: 12/13/21  2040     History  Chief Complaint  Patient presents with   Anxiety    Nathan Bradford is a 31 y.o. male.  31 year old male with prior medical history as detailed below presents for evaluation of reported anxiety.  Patient reports that he has frequent episodes of anxiety and stress.  Patient reports that recently he has had increased stress at work.  Tonight he had what he describes as a panic attack.  He felt shaky.  He was particularly anxious.  He reports that during this episode he had quick periods of being "in and out of consciousness."  He reports that he has taken Xanax in the past for the symptoms.  EMS encouraged him to take 1 dose of his Xanax prior to arrival.  He reports that he is feeling significantly better now.  He denies any associated chest pain or shortness of breath.  He denies associated nausea.  He denies focal weakness.  He denies SI or HI.  He denies any recent injury or head trauma.  The history is provided by the patient.  Anxiety This is a chronic problem. The current episode started more than 1 week ago. The problem occurs daily. The problem has been gradually improving. Nothing aggravates the symptoms. Nothing relieves the symptoms.      Home Medications Prior to Admission medications   Medication Sig Start Date End Date Taking? Authorizing Provider  albuterol (VENTOLIN HFA) 108 (90 Base) MCG/ACT inhaler Inhale 2 puffs into the lungs every 6 (six) hours as needed for wheezing or shortness of breath. Patient not taking: Reported on 06/12/2021 09/23/20   Nicolette Bang, MD  ALPRAZolam Duanne Moron) 0.25 MG tablet Take 1 tablet (0.25 mg total) by mouth 2 (two) times daily as needed for anxiety. Patient not taking: Reported on 06/12/2021 04/08/21   Nicolette Bang, MD  amLODipine (NORVASC) 10 MG tablet Take 1 tablet (10 mg total) by mouth  daily. 04/15/21   Horald Pollen, MD  atorvastatin (LIPITOR) 20 MG tablet Take 1 tablet (20 mg total) by mouth daily. 09/23/20   Nicolette Bang, MD  busPIRone (BUSPAR) 7.5 MG tablet Take 1 tablet (7.5 mg total) by mouth 2 (two) times daily. Patient not taking: Reported on 06/12/2021 10/21/20   Nicolette Bang, MD  indapamide (LOZOL) 2.5 MG tablet Take 1 tablet (2.5 mg total) by mouth daily. 05/29/21   Hoyt Koch, MD  loratadine (CLARITIN) 10 MG tablet Take 1 tablet (10 mg total) by mouth daily. 09/23/20   Nicolette Bang, MD  pantoprazole (PROTONIX) 40 MG tablet Take 1 tablet (40 mg total) by mouth daily. 05/29/21   Hoyt Koch, MD  potassium chloride SA (KLOR-CON) 20 MEQ tablet Take 2 tablets (40 mEq total) by mouth daily for 7 days. 06/12/21 06/19/21  Hoyt Koch, MD      Allergies    Chlorthalidone and Lisinopril    Review of Systems   Review of Systems  All other systems reviewed and are negative.  Physical Exam Updated Vital Signs There were no vitals taken for this visit. Physical Exam Vitals and nursing note reviewed.  Constitutional:      General: He is not in acute distress.    Appearance: Normal appearance. He is well-developed.  HENT:     Head: Normocephalic and atraumatic.  Eyes:     Conjunctiva/sclera: Conjunctivae normal.  Pupils: Pupils are equal, round, and reactive to light.  Cardiovascular:     Rate and Rhythm: Normal rate and regular rhythm.     Heart sounds: Normal heart sounds.  Pulmonary:     Effort: Pulmonary effort is normal. No respiratory distress.     Breath sounds: Normal breath sounds.  Abdominal:     General: There is no distension.     Palpations: Abdomen is soft.     Tenderness: There is no abdominal tenderness.  Musculoskeletal:        General: No deformity. Normal range of motion.     Cervical back: Normal range of motion and neck supple.  Skin:    General: Skin is warm and dry.   Neurological:     General: No focal deficit present.     Mental Status: He is alert and oriented to person, place, and time.    ED Results / Procedures / Treatments   Labs (all labs ordered are listed, but only abnormal results are displayed) Labs Reviewed - No data to display  EKG None  Radiology No results found.  Procedures Procedures    Medications Ordered in ED Medications  LORazepam (ATIVAN) tablet 0.5 mg (has no administration in time range)    ED Course/ Medical Decision Making/ A&P                           Medical Decision Making  This patient presents to the ED with complaint of anxiety and panic attack.  Patient with longstanding history of same.  The differential diagnosis includes anxiety, panic attack, arrhythmia, ACS, infection. Etc.   Co morbidities that complicate the patient evaluation  Anxiety, asthma, hypertension   Additional history obtained:  Additional history obtained from responding EMS unit. External records from outside source obtained and reviewed including prior outpatient records of care.   Lab Tests:  I Ordered, and personally interpreted labs.  The pertinent results include: EKG is without acute ischemic changes.      Medicines ordered and prescription drug management:  I ordered medication including Ativan for anxiety Reevaluation of the patient after these medicines showed that the patient improved I have reviewed the patients home medicines and have made adjustments as needed   Critical Interventions:  Thorough history and exam and discussion of the multimodal treatment options for treatment of anxiety in the outpatient setting.   Problem List / ED Course:  Anxiety / Panic attack.   Reevaluation:  After the interventions noted above, I reevaluated the patient and found that they have :improved    Dispostion:  After consideration of the diagnostic results and the patients response to treatment, I feel  that the patent would benefit from close outpatient follow-up.    Patient presents with anxiety.  Symptoms improved with administration of Ativan here in the ED.  Patient without indication for additional mental health evaluation or other medical issues.  Patient does feel improved at time of discharge.  He has already arranged follow-up scheduled for tomorrow with his PCP.  He does understand need for close follow-up.  Strict return precautions given and understood.           Final Clinical Impression(s) / ED Diagnoses Final diagnoses:  Anxiety    Rx / DC Orders ED Discharge Orders     None         Valarie Merino, MD 12/13/21 2317

## 2021-12-15 ENCOUNTER — Encounter: Payer: Self-pay | Admitting: Internal Medicine

## 2021-12-15 ENCOUNTER — Ambulatory Visit (INDEPENDENT_AMBULATORY_CARE_PROVIDER_SITE_OTHER): Payer: 59 | Admitting: Internal Medicine

## 2021-12-15 ENCOUNTER — Other Ambulatory Visit: Payer: Self-pay

## 2021-12-15 VITALS — BP 128/82 | HR 82 | Resp 18 | Ht 72.0 in | Wt 265.4 lb

## 2021-12-15 DIAGNOSIS — R7303 Prediabetes: Secondary | ICD-10-CM

## 2021-12-15 DIAGNOSIS — E782 Mixed hyperlipidemia: Secondary | ICD-10-CM

## 2021-12-15 DIAGNOSIS — I10 Essential (primary) hypertension: Secondary | ICD-10-CM | POA: Diagnosis not present

## 2021-12-15 DIAGNOSIS — F319 Bipolar disorder, unspecified: Secondary | ICD-10-CM | POA: Diagnosis not present

## 2021-12-15 DIAGNOSIS — F410 Panic disorder [episodic paroxysmal anxiety] without agoraphobia: Secondary | ICD-10-CM

## 2021-12-15 LAB — VITAMIN D 25 HYDROXY (VIT D DEFICIENCY, FRACTURES): VITD: 8.89 ng/mL — ABNORMAL LOW (ref 30.00–100.00)

## 2021-12-15 LAB — COMPREHENSIVE METABOLIC PANEL
ALT: 11 U/L (ref 0–53)
AST: 13 U/L (ref 0–37)
Albumin: 4.1 g/dL (ref 3.5–5.2)
Alkaline Phosphatase: 56 U/L (ref 39–117)
BUN: 15 mg/dL (ref 6–23)
CO2: 30 mEq/L (ref 19–32)
Calcium: 9.3 mg/dL (ref 8.4–10.5)
Chloride: 101 mEq/L (ref 96–112)
Creatinine, Ser: 1.19 mg/dL (ref 0.40–1.50)
GFR: 82.3 mL/min (ref >60.00)
Glucose, Bld: 80 mg/dL (ref 70–99)
Potassium: 3.2 mEq/L — ABNORMAL LOW (ref 3.5–5.1)
Sodium: 139 mEq/L (ref 135–145)
Total Bilirubin: 1.3 mg/dL — ABNORMAL HIGH (ref 0.2–1.2)
Total Protein: 7.2 g/dL (ref 6.0–8.3)

## 2021-12-15 LAB — CBC
HCT: 44.3 % (ref 39.0–52.0)
Hemoglobin: 14.4 g/dL (ref 13.0–17.0)
MCHC: 32.6 g/dL (ref 30.0–36.0)
MCV: 88.6 fl (ref 78.0–100.0)
Platelets: 312 10*3/uL (ref 150.0–400.0)
RBC: 5.01 Mil/uL (ref 4.22–5.81)
RDW: 14 % (ref 11.5–15.5)
WBC: 3.8 10*3/uL — ABNORMAL LOW (ref 4.0–10.5)

## 2021-12-15 LAB — HEMOGLOBIN A1C: Hgb A1c MFr Bld: 5.7 % (ref 4.6–6.5)

## 2021-12-15 LAB — VITAMIN B12: Vitamin B-12: 183 pg/mL — ABNORMAL LOW (ref 211–911)

## 2021-12-15 LAB — TSH: TSH: 1.16 u[IU]/mL (ref 0.35–5.50)

## 2021-12-15 LAB — T4, FREE: Free T4: 0.93 ng/dL (ref 0.60–1.60)

## 2021-12-15 MED ORDER — CARIPRAZINE HCL 1.5 MG PO CAPS: 1.5000 mg | ORAL_CAPSULE | Freq: Every day | ORAL | 0 refills | Status: DC

## 2021-12-15 NOTE — Progress Notes (Signed)
° °  Subjective:   Patient ID: Nathan Bradford, male    DOB: 12/28/1990, 31 y.o.   MRN: [id number]  HPI The patient is a 31 YO man coming in for ER visit and anxiety/bipolar disease. Not treated at this time for this. Not taking anything on a regular basis. Relates being in clinical trial awhile ago and given mood stabilizer that did not work. Unknown name. Feeling less need to sleep currently. 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   Review of Systems  Constitutional:  Positive for activity change and appetite change.  HENT: Negative.    Eyes: Negative.   Respiratory:  Negative for cough, chest tightness and shortness of breath.   Cardiovascular:  Negative for chest pain, palpitations and leg swelling.  Gastrointestinal:  Negative for abdominal distention, abdominal pain, constipation, diarrhea, nausea and vomiting.  Musculoskeletal: Negative.   Skin: Negative.   Neurological: Negative.   Psychiatric/Behavioral:  Positive for decreased concentration, dysphoric mood and sleep disturbance. The patient is nervous/anxious and is hyperactive.    Objective:  Physical Exam Constitutional:      Appearance: He is well-developed.  HENT:     Head: Normocephalic and atraumatic.  Cardiovascular:     Rate and Rhythm: Normal rate and regular rhythm.  Pulmonary:     Effort: Pulmonary effort is normal. No respiratory distress.     Breath sounds: Normal breath sounds. No wheezing or rales.  Abdominal:     General: Bowel sounds are normal. There is no distension.     Palpations: Abdomen is soft.     Tenderness: There is no abdominal tenderness. There is no rebound.  Musculoskeletal:     Cervical back: Normal range of motion.  Skin:    General: Skin is warm and dry.  Neurological:     Mental Status: He is alert and oriented to person, place, and time.     Coordination: Coordination normal.  Psychiatric:     Comments: Some flightiness  of ideas    Vitals:   12/15/21 1426  BP: 128/82  Pulse: 82  Resp: 18  SpO2: 97%  Weight: 265 lb 6.4 oz (120.4 kg)  Height: 6' (1.829 m)    This visit occurred during the SARS-CoV-2 public health emergency.  Safety protocols were in place, including screening questions prior to the visit, additional usage of staff PPE, and extensive cleaning of exam room while observing appropriate contact time as indicated for disinfecting solutions.   Assessment & Plan:  Visit time 30 minutes in face to face communication with patient and coordination of care, additional 15 minutes spent in record review, coordination or care, ordering tests, communicating/referring to other healthcare professionals, documenting in medical records all on the same day of the visit for total time 45 minutes spent on the visit.

## 2021-12-15 NOTE — Patient Instructions (Addendum)
We will get you in with the counselor. We could try vraylar for the mood if you want. We have sent this in in case you want to try it. Take 1 pill daily for 2-3 weeks then let us know how you are doing.

## 2021-12-17 DIAGNOSIS — E785 Hyperlipidemia, unspecified: Secondary | ICD-10-CM | POA: Insufficient documentation

## 2021-12-17 NOTE — Assessment & Plan Note (Signed)
Checking HgA1c today and treat as appropriate. Some increased thirst which could mean sugars are elevated.

## 2021-12-17 NOTE — Assessment & Plan Note (Signed)
Rx vraylar to see if this will help. Needs follow up about 2-4 weeks after initiation. Referral for urgent counseling. Appears to be in a mixed or manic episode currently.

## 2021-12-17 NOTE — Assessment & Plan Note (Signed)
Needs CMP for evaluation of regimen. BP is at goal on current regimen of indapamide 2.5 mg daily and amlodipine 10 mg daily which he is taking regularly.

## 2021-12-17 NOTE — Assessment & Plan Note (Signed)
Taking lipitor 20 mg daily. Checking lipid panel in about 6 months.

## 2021-12-18 ENCOUNTER — Other Ambulatory Visit: Payer: Self-pay | Admitting: Internal Medicine

## 2021-12-18 ENCOUNTER — Telehealth: Payer: Self-pay | Admitting: Internal Medicine

## 2021-12-18 MED ORDER — VITAMIN D (ERGOCALCIFEROL) 1.25 MG (50000 UNIT) PO CAPS: 50000.0000 [IU] | ORAL_CAPSULE | ORAL | 0 refills | Status: DC

## 2021-12-18 NOTE — Telephone Encounter (Signed)
Patient called in stating he needs  refill on all of his prescriptions  that are on his med list, the patients last visit was on 12/15/2021. Patient states that only 1 prescription was sent to the pharmacy.

## 2021-12-18 NOTE — Telephone Encounter (Signed)
Or call pharmacy to see what has been filled in last 3 months

## 2021-12-18 NOTE — Telephone Encounter (Signed)
Please call and clarify what medications he is taking. Most he should have run out of long ago based on refills. Once I get that I can assess what should be refilled.

## 2021-12-23 NOTE — Telephone Encounter (Signed)
Called pt. LVM asking him to returning my call to discuss his refills. Office number was provided. Mychart message was sent as well.

## 2021-12-23 NOTE — Telephone Encounter (Signed)
Rep w/ [facility] states she will be faxing a form for additional information regarding medications patient has tried and failed for PA on cariprazine (VRAYLAR) 1.5 MG capsule

## 2021-12-24 NOTE — Telephone Encounter (Signed)
Noted  

## 2021-12-25 ENCOUNTER — Other Ambulatory Visit: Payer: Self-pay | Admitting: Internal Medicine

## 2021-12-25 DIAGNOSIS — F411 Generalized anxiety disorder: Secondary | ICD-10-CM

## 2022-02-04 ENCOUNTER — Other Ambulatory Visit: Payer: Self-pay | Admitting: Internal Medicine

## 2022-02-05 MED ORDER — POTASSIUM CHLORIDE CRYS ER 20 MEQ PO TBCR: 40.0000 meq | EXTENDED_RELEASE_TABLET | Freq: Every day | ORAL | 0 refills | Status: DC

## 2022-02-05 MED ORDER — VITAMIN D (ERGOCALCIFEROL) 1.25 MG (50000 UNIT) PO CAPS: 50000.0000 [IU] | ORAL_CAPSULE | ORAL | 0 refills | Status: DC

## 2022-02-05 MED ORDER — CARIPRAZINE HCL 1.5 MG PO CAPS: 1.5000 mg | ORAL_CAPSULE | Freq: Every day | ORAL | 0 refills | Status: DC

## 2022-02-09 ENCOUNTER — Encounter: Payer: Self-pay | Admitting: Internal Medicine

## 2022-02-09 ENCOUNTER — Ambulatory Visit (INDEPENDENT_AMBULATORY_CARE_PROVIDER_SITE_OTHER): Payer: 59 | Admitting: Internal Medicine

## 2022-02-09 ENCOUNTER — Other Ambulatory Visit: Payer: Self-pay

## 2022-02-09 VITALS — BP 128/86 | HR 84 | Resp 18 | Ht 72.0 in | Wt 263.8 lb

## 2022-02-09 DIAGNOSIS — I10 Essential (primary) hypertension: Secondary | ICD-10-CM

## 2022-02-09 DIAGNOSIS — F411 Generalized anxiety disorder: Secondary | ICD-10-CM | POA: Diagnosis not present

## 2022-02-09 DIAGNOSIS — E559 Vitamin D deficiency, unspecified: Secondary | ICD-10-CM

## 2022-02-09 DIAGNOSIS — E785 Hyperlipidemia, unspecified: Secondary | ICD-10-CM

## 2022-02-09 DIAGNOSIS — R519 Headache, unspecified: Secondary | ICD-10-CM | POA: Insufficient documentation

## 2022-02-09 DIAGNOSIS — R361 Hematospermia: Secondary | ICD-10-CM

## 2022-02-09 DIAGNOSIS — E538 Deficiency of other specified B group vitamins: Secondary | ICD-10-CM | POA: Diagnosis not present

## 2022-02-09 DIAGNOSIS — G44201 Tension-type headache, unspecified, intractable: Secondary | ICD-10-CM | POA: Diagnosis not present

## 2022-02-09 MED ORDER — ALPRAZOLAM 0.25 MG PO TABS: 0.2500 mg | ORAL_TABLET | Freq: Every day | ORAL | 0 refills | Status: DC | PRN

## 2022-02-09 MED ORDER — INDAPAMIDE 2.5 MG PO TABS: 2.5000 mg | ORAL_TABLET | Freq: Every day | ORAL | 3 refills | Status: DC

## 2022-02-09 MED ORDER — KETOROLAC TROMETHAMINE 30 MG/ML IJ SOLN
30.0000 mg | Freq: Once | INTRAMUSCULAR | Status: AC
  Administered 2022-02-09: 30 mg via INTRAMUSCULAR

## 2022-02-09 MED ORDER — LORATADINE 10 MG PO TABS: 10.0000 mg | ORAL_TABLET | Freq: Every day | ORAL | 11 refills | Status: DC

## 2022-02-09 MED ORDER — CYANOCOBALAMIN 1000 MCG/ML IJ SOLN
1000.0000 ug | Freq: Once | INTRAMUSCULAR | Status: AC
  Administered 2022-02-09: 1000 ug via INTRAMUSCULAR

## 2022-02-09 MED ORDER — AMLODIPINE BESYLATE 10 MG PO TABS: 10.0000 mg | ORAL_TABLET | Freq: Every day | ORAL | 3 refills | Status: DC

## 2022-02-09 MED ORDER — ATORVASTATIN CALCIUM 20 MG PO TABS: 20.0000 mg | ORAL_TABLET | Freq: Every day | ORAL | 3 refills | Status: DC

## 2022-02-09 NOTE — Assessment & Plan Note (Addendum)
Given toradol 30 mg IM at visit, could be from being out of BP medications so these were refilled at today's visit.

## 2022-02-09 NOTE — Progress Notes (Signed)
° °  Subjective:   Patient ID: Nathan Bradford, male    DOB: Dec 12, 1991, 31 y.o.   MRN: [id number]  HPI The patient is a 31 YO man coming in for several concerns.  Review of Systems  Constitutional: Negative.   HENT: Negative.    Eyes: Negative.   Respiratory:  Negative for cough, chest tightness and shortness of breath.   Cardiovascular:  Negative for chest pain, palpitations and leg swelling.  Gastrointestinal:  Negative for abdominal distention, abdominal pain, constipation, diarrhea, nausea and vomiting.  Genitourinary:        Hematospermia  Musculoskeletal: Negative.   Skin: Negative.   Neurological: Negative.   Psychiatric/Behavioral:  Positive for decreased concentration. The patient is nervous/anxious.    Objective:  Physical Exam  Vitals:   02/09/22 1424  BP: 130/90  Pulse: 84  Resp: 18  SpO2: 97%  Weight: 263 lb 12.8 oz (119.7 kg)  Height: 6' (1.829 m)    This visit occurred during the SARS-CoV-2 public health emergency.  Safety protocols were in place, including screening questions prior to the visit, additional usage of staff PPE, and extensive cleaning of exam room while observing appropriate contact time as indicated for disinfecting solutions.   Assessment & Plan:  B12 and toradol 30 mg IM given at visit

## 2022-02-09 NOTE — Patient Instructions (Signed)
We will check the labs today and let you know if we find a cause.

## 2022-02-12 DIAGNOSIS — E559 Vitamin D deficiency, unspecified: Secondary | ICD-10-CM | POA: Insufficient documentation

## 2022-02-12 NOTE — Assessment & Plan Note (Signed)
Advised to pick up and take 3 months of weekly 50000 units vitamin D until gone. ?

## 2022-02-12 NOTE — Assessment & Plan Note (Signed)
Needed refill on medications. It is unclear which he has been taking should have been out of medication some time ago. BP is borderline normal on recheck. Refill indapamide 2.5 mg daily and amlodipine 10 mg daily and will continue both. ?

## 2022-02-12 NOTE — Assessment & Plan Note (Signed)
Did not follow up for replacement injections. Given B12 shot today.  ?

## 2022-02-12 NOTE — Assessment & Plan Note (Signed)
Checking full STD panel. Could be traumatic. If no cause will refill to urology. No blood with urination just ejaculation.  ?

## 2022-02-19 ENCOUNTER — Encounter: Payer: 59 | Admitting: Internal Medicine

## 2022-03-29 ENCOUNTER — Ambulatory Visit (INDEPENDENT_AMBULATORY_CARE_PROVIDER_SITE_OTHER): Payer: 59 | Admitting: Internal Medicine

## 2022-03-29 ENCOUNTER — Encounter: Payer: Self-pay | Admitting: Internal Medicine

## 2022-03-29 DIAGNOSIS — R0782 Intercostal pain: Secondary | ICD-10-CM

## 2022-03-29 DIAGNOSIS — F3177 Bipolar disorder, in partial remission, most recent episode mixed: Secondary | ICD-10-CM

## 2022-03-29 DIAGNOSIS — F411 Generalized anxiety disorder: Secondary | ICD-10-CM

## 2022-03-29 MED ORDER — ALPRAZOLAM 0.25 MG PO TABS: 0.2500 mg | ORAL_TABLET | Freq: Every day | ORAL | 5 refills | Status: DC | PRN

## 2022-03-29 NOTE — Patient Instructions (Signed)
We will watch the spot on the chest and if this is still shrinking watch it. ? ? ?

## 2022-03-29 NOTE — Progress Notes (Signed)
? ?  Subjective:  ? ?Patient ID: Nathan Bradford, male    DOB: 03/20/1991, 31 y.o.   MRN: [id number] ? ?HPI ?The patient is a 30 YO man coming in for new lump on left chest after injury and follow up. ? ?Review of Systems  ?Constitutional: Negative.   ?HENT: Negative.    ?Eyes: Negative.   ?Respiratory:  Negative for cough, chest tightness and shortness of breath.   ?Cardiovascular:  Positive for chest pain. Negative for palpitations and leg swelling.  ?Gastrointestinal:  Negative for abdominal distention, abdominal pain, constipation, diarrhea, nausea and vomiting.  ?Musculoskeletal: Negative.   ?Skin: Negative.   ?Neurological: Negative.   ?Psychiatric/Behavioral:  Positive for decreased concentration and dysphoric mood. The patient is nervous/anxious.   ? ?Objective:  ?Physical Exam ?Constitutional:   ?   Appearance: He is well-developed.  ?HENT:  ?   Head: Normocephalic and atraumatic.  ?Cardiovascular:  ?   Rate and Rhythm: Normal rate and regular rhythm.  ?   Comments: Small hematoma left chest wall with tenderness to palpation ?Pulmonary:  ?   Effort: Pulmonary effort is normal. No respiratory distress.  ?   Breath sounds: Normal breath sounds. No wheezing or rales.  ?Abdominal:  ?   General: Bowel sounds are normal. There is no distension.  ?   Palpations: Abdomen is soft.  ?   Tenderness: There is no abdominal tenderness. There is no rebound.  ?Musculoskeletal:  ?   Cervical back: Normal range of motion.  ?Skin: ?   General: Skin is warm and dry.  ?Neurological:  ?   Mental Status: He is alert and oriented to person, place, and time.  ?   Coordination: Coordination normal.  ? ? ?Vitals:  ? 03/29/22 1444  ?BP: 118/70  ?Pulse: 85  ?Resp: 18  ?SpO2: 95%  ?Weight: 264 lb 3.2 oz (119.8 kg)  ?Height: 6' (1.829 m)  ? ? ?This visit occurred during the SARS-CoV-2 public health emergency.  Safety protocols were in place, including screening questions prior to the visit, additional usage of staff PPE, and extensive cleaning  of exam room while observing appropriate contact time as indicated for disinfecting solutions.  ? ?Assessment & Plan:  ? ?

## 2022-03-31 DIAGNOSIS — R079 Chest pain, unspecified: Secondary | ICD-10-CM | POA: Insufficient documentation

## 2022-03-31 NOTE — Assessment & Plan Note (Signed)
There was an injury with hematoma on the left chest wall. This is resolving over the last month and is still present <1cm and tender. Will monitor given strong family history of breast cancer and a male cousin with breast cancer. If no resolution in 1 month needs mammogram and [place] for assessment.  ?

## 2022-03-31 NOTE — Assessment & Plan Note (Signed)
Supposed to be taking vraylar but is unsure if he was able to start this. Mood is not well controlled with current anxiety and some depression. ?

## 2022-03-31 NOTE — Assessment & Plan Note (Signed)
Refilled alprazolam and is taking 0.25 mg daily as needed which is doing well. He is supposed to be taking vraylar which he is unsure if he started or not. ?

## 2022-04-13 ENCOUNTER — Telehealth: Payer: Self-pay | Admitting: Internal Medicine

## 2022-04-13 NOTE — Telephone Encounter (Signed)
Nathan Bradford called to give key for PA sent for Vraylar  ? ?[id number] ? ?Fax number provided to send PA  ?

## 2022-04-14 NOTE — Telephone Encounter (Signed)
Unable to pull up PA with key that was provided.  ? ?Prior authorization has been initiated on covermymeds ?Key: [id number] ?Rx #: [id number] ?

## 2022-06-11 ENCOUNTER — Ambulatory Visit: Payer: 59

## 2022-06-14 ENCOUNTER — Ambulatory Visit: Payer: 59

## 2022-11-16 ENCOUNTER — Telehealth: Payer: Self-pay | Admitting: Internal Medicine

## 2022-11-16 ENCOUNTER — Encounter: Payer: Self-pay | Admitting: Internal Medicine

## 2022-11-16 ENCOUNTER — Ambulatory Visit (INDEPENDENT_AMBULATORY_CARE_PROVIDER_SITE_OTHER): Payer: BC Managed Care – PPO | Admitting: Internal Medicine

## 2022-11-16 ENCOUNTER — Other Ambulatory Visit: Payer: Self-pay | Admitting: Internal Medicine

## 2022-11-16 ENCOUNTER — Other Ambulatory Visit: Payer: Self-pay

## 2022-11-16 VITALS — BP 180/140 | HR 92 | Temp 98.3°F | Ht 72.0 in | Wt 272.0 lb

## 2022-11-16 DIAGNOSIS — J0110 Acute frontal sinusitis, unspecified: Secondary | ICD-10-CM

## 2022-11-16 DIAGNOSIS — F411 Generalized anxiety disorder: Secondary | ICD-10-CM

## 2022-11-16 DIAGNOSIS — I10 Essential (primary) hypertension: Secondary | ICD-10-CM

## 2022-11-16 DIAGNOSIS — E785 Hyperlipidemia, unspecified: Secondary | ICD-10-CM | POA: Diagnosis not present

## 2022-11-16 DIAGNOSIS — J019 Acute sinusitis, unspecified: Secondary | ICD-10-CM | POA: Insufficient documentation

## 2022-11-16 DIAGNOSIS — D234 Other benign neoplasm of skin of scalp and neck: Secondary | ICD-10-CM

## 2022-11-16 MED ORDER — INDAPAMIDE 2.5 MG PO TABS: 2.5000 mg | ORAL_TABLET | Freq: Every day | ORAL | 3 refills | Status: DC

## 2022-11-16 MED ORDER — AMOXICILLIN-POT CLAVULANATE 875-125 MG PO TABS: 1.0000 | ORAL_TABLET | Freq: Two times a day (BID) | ORAL | 0 refills | Status: AC

## 2022-11-16 MED ORDER — LORATADINE 10 MG PO TABS: 10.0000 mg | ORAL_TABLET | Freq: Every day | ORAL | 11 refills | Status: DC

## 2022-11-16 MED ORDER — AMOXICILLIN-POT CLAVULANATE 875-125 MG PO TABS: 1.0000 | ORAL_TABLET | Freq: Two times a day (BID) | ORAL | 0 refills | Status: DC

## 2022-11-16 MED ORDER — AMLODIPINE BESYLATE 10 MG PO TABS
10.0000 mg | ORAL_TABLET | Freq: Every day | ORAL | 3 refills | Status: DC
Start: 2022-11-16 — End: 2022-11-16

## 2022-11-16 MED ORDER — PREDNISONE 20 MG PO TABS: 40.0000 mg | ORAL_TABLET | Freq: Every day | ORAL | 0 refills | Status: DC

## 2022-11-16 MED ORDER — ALBUTEROL SULFATE HFA 108 (90 BASE) MCG/ACT IN AERS: 2.0000 | INHALATION_SPRAY | Freq: Four times a day (QID) | RESPIRATORY_TRACT | 5 refills | Status: DC | PRN

## 2022-11-16 MED ORDER — ATORVASTATIN CALCIUM 20 MG PO TABS: 20.0000 mg | ORAL_TABLET | Freq: Every day | ORAL | 3 refills | Status: AC

## 2022-11-16 MED ORDER — LORATADINE 10 MG PO TABS: 10.0000 mg | ORAL_TABLET | Freq: Every day | ORAL | 11 refills | Status: AC

## 2022-11-16 MED ORDER — AMLODIPINE BESYLATE 10 MG PO TABS: 10.0000 mg | ORAL_TABLET | Freq: Every day | ORAL | 3 refills | Status: DC

## 2022-11-16 MED ORDER — ATORVASTATIN CALCIUM 20 MG PO TABS: 20.0000 mg | ORAL_TABLET | Freq: Every day | ORAL | 3 refills | Status: DC

## 2022-11-16 MED ORDER — INDAPAMIDE 2.5 MG PO TABS
2.5000 mg | ORAL_TABLET | Freq: Every day | ORAL | 3 refills | Status: DC
Start: 2022-11-16 — End: 2022-11-16

## 2022-11-16 MED ORDER — ALBUTEROL SULFATE HFA 108 (90 BASE) MCG/ACT IN AERS: 2.0000 | INHALATION_SPRAY | Freq: Four times a day (QID) | RESPIRATORY_TRACT | 5 refills | Status: AC | PRN

## 2022-11-16 MED ORDER — ALPRAZOLAM 0.25 MG PO TABS: 0.2500 mg | ORAL_TABLET | Freq: Every day | ORAL | 5 refills | Status: DC | PRN

## 2022-11-16 NOTE — Assessment & Plan Note (Signed)
Having more panic episodes with new employment and he did recently leave this job. Continue alprazolam 0.25 mg daily prn. He was supposed to start vraylar but does not know if he did and was only given 1 month back in Feb 2023 so likely not taking any longer. Was okay without this job so wants to hold off on controller medication for now and see how he does without that job.

## 2022-11-16 NOTE — Assessment & Plan Note (Signed)
BP severely high today and out of medications although he should have refills. We have sent in refill today on amlodipine 10 mg daily and indapamide 2.5 mg daily and previously at goal on this regimen. Follow up in 2-4 weeks to assess once back on regimen.

## 2022-11-16 NOTE — Telephone Encounter (Signed)
Pt says her [facility] is [facility].  Please send ALL prescriptions from today to:  [id number] [place] [place] - [id number]

## 2022-11-16 NOTE — Telephone Encounter (Signed)
Alprazolam sent to corrected pharmacy. Vraylar was not prescribed today and should not be. Can you call and cancel xanax at other pharmacy

## 2022-11-16 NOTE — Addendum Note (Signed)
Addended by: Pricilla Holm A on: 11/16/2022 01:40 PM   Modules accepted: Orders

## 2022-11-16 NOTE — Progress Notes (Signed)
   Subjective:   Patient ID: Nathan Bradford, male    DOB: 1991/08/09, 31 y.o.   MRN: [id number]  HPI The patient is a 31 YO man coming in for several concerns.   Review of Systems  Constitutional:  Positive for activity change, appetite change and chills. Negative for fatigue, fever and unexpected weight change.  HENT:  Positive for congestion, postnasal drip, rhinorrhea and sinus pressure. Negative for ear discharge, ear pain, sinus pain, sneezing, sore throat, tinnitus, trouble swallowing and voice change.   Eyes: Negative.   Respiratory:  Positive for cough and shortness of breath. Negative for chest tightness and wheezing.   Cardiovascular: Negative.  Negative for chest pain, palpitations and leg swelling.  Gastrointestinal: Negative.  Negative for abdominal distention, abdominal pain, constipation, diarrhea, nausea and vomiting.  Musculoskeletal:  Positive for myalgias.  Skin: Negative.   Neurological: Negative.   Psychiatric/Behavioral:  Positive for decreased concentration, dysphoric mood and sleep disturbance. The patient is nervous/anxious.     Objective:  Physical Exam Constitutional:      Appearance: He is well-developed. He is obese.  HENT:     Head: Normocephalic and atraumatic.     Comments: Oropharynx with redness and clear drainage, nose with swollen turbinates, TMs normal bilaterally.  Neck:     Thyroid: No thyromegaly.  Cardiovascular:     Rate and Rhythm: Normal rate and regular rhythm.  Pulmonary:     Effort: Pulmonary effort is normal. No respiratory distress.     Breath sounds: Normal breath sounds. No wheezing or rales.  Abdominal:     General: Bowel sounds are normal. There is no distension.     Palpations: Abdomen is soft.     Tenderness: There is no abdominal tenderness. There is no rebound.  Musculoskeletal:        General: Tenderness present.     Cervical back: Normal range of motion.  Lymphadenopathy:     Cervical: No cervical adenopathy.  Skin:     General: Skin is warm and dry.  Neurological:     Mental Status: He is alert and oriented to person, place, and time.     Coordination: Coordination normal.     Vitals:   11/16/22 1044 11/16/22 1052 11/16/22 1106  BP: (!) 220/180 (!) 220/180 (!) 180/140  Pulse: 92    Temp: 98.3 F (36.8 C)    TempSrc: Oral    SpO2: 98%    Weight: 272 lb (123.4 kg)    Height: 6' (1.829 m)      Assessment & Plan:

## 2022-11-16 NOTE — Telephone Encounter (Signed)
  ALPRAZolam (XANAX) 0.25 MG tablet    cariprazine (VRAYLAR) 1.5 MG capsule   The above medication was sent to wrong pharmacy. Pt is asking that they be sent to updated pharmacy on file.

## 2022-11-16 NOTE — Assessment & Plan Note (Signed)
He does have several cysts on the scalp and he wishes to see dermatology. Referral done.

## 2022-11-16 NOTE — Telephone Encounter (Signed)
Spoke with pharmacist at the [facility] and was able to cancel all medications sent in.

## 2022-11-16 NOTE — Telephone Encounter (Signed)
PT CALLED AGAIN to say he was wrong about the address for the pharmacy. DO NOT send to [address].  PT REQUESTING ALL RX TODAY [facility] [address]  Pt says change preferred pharmacy as well.

## 2022-11-16 NOTE — Assessment & Plan Note (Signed)
States ran out of refills for lipitor 20 mg daily so refilled for him today.

## 2022-11-16 NOTE — Patient Instructions (Signed)
We have sent in prednisone to take 2 pills a day for 5 days.   We have also sent in augmentin which is an antibiotic to take 1 pill twice a day for 1 week to clear the sinuses and should help the dizziness.

## 2022-11-16 NOTE — Assessment & Plan Note (Signed)
Rx augmentin 1 week supply and also rx prednisone 5 day course

## 2022-11-25 ENCOUNTER — Telehealth: Payer: Self-pay

## 2022-11-25 NOTE — Telephone Encounter (Signed)
PA has been sent in for Vraylar (Key: [id number])

## 2022-12-07 ENCOUNTER — Telehealth: Payer: Self-pay

## 2022-12-07 NOTE — Telephone Encounter (Signed)
Patient has been denied for [facility] 1.'5mg'$  due to unmet criteria.

## 2022-12-14 ENCOUNTER — Ambulatory Visit (INDEPENDENT_AMBULATORY_CARE_PROVIDER_SITE_OTHER): Payer: BC Managed Care – PPO | Admitting: Internal Medicine

## 2022-12-14 ENCOUNTER — Encounter: Payer: Self-pay | Admitting: Internal Medicine

## 2022-12-14 VITALS — BP 200/142 | HR 80 | Temp 98.9°F | Ht 72.0 in | Wt 272.0 lb

## 2022-12-14 DIAGNOSIS — I10 Essential (primary) hypertension: Secondary | ICD-10-CM

## 2022-12-14 DIAGNOSIS — R350 Frequency of micturition: Secondary | ICD-10-CM | POA: Diagnosis not present

## 2022-12-14 DIAGNOSIS — R7303 Prediabetes: Secondary | ICD-10-CM | POA: Diagnosis not present

## 2022-12-14 LAB — COMPREHENSIVE METABOLIC PANEL
ALT: 19 U/L (ref 0–53)
AST: 17 U/L (ref 0–37)
Albumin: 3.9 g/dL (ref 3.5–5.2)
Alkaline Phosphatase: 64 U/L (ref 39–117)
BUN: 9 mg/dL (ref 6–23)
CO2: 31 mEq/L (ref 19–32)
Calcium: 9.1 mg/dL (ref 8.4–10.5)
Chloride: 102 mEq/L (ref 96–112)
Creatinine, Ser: 1.16 mg/dL (ref 0.40–1.50)
GFR: 84.26 mL/min (ref >60.00)
Glucose, Bld: 116 mg/dL — ABNORMAL HIGH (ref 70–99)
Potassium: 3 mEq/L — ABNORMAL LOW (ref 3.5–5.1)
Sodium: 141 mEq/L (ref 135–145)
Total Bilirubin: 0.6 mg/dL (ref 0.2–1.2)
Total Protein: 6.9 g/dL (ref 6.0–8.3)

## 2022-12-14 LAB — LIPID PANEL
Cholesterol: 155 mg/dL (ref 0–200)
HDL: 39.7 mg/dL (ref >39.00)
LDL Cholesterol: 102 mg/dL — ABNORMAL HIGH (ref 0–99)
NonHDL: 115.1
Total CHOL/HDL Ratio: 4
Triglycerides: 66 mg/dL (ref 0.0–149.0)
VLDL: 13.2 mg/dL (ref 0.0–40.0)

## 2022-12-14 LAB — CBC
HCT: 43.1 % (ref 39.0–52.0)
Hemoglobin: 14.4 g/dL (ref 13.0–17.0)
MCHC: 33.4 g/dL (ref 30.0–36.0)
MCV: 87.4 fl (ref 78.0–100.0)
Platelets: 383 10*3/uL (ref 150.0–400.0)
RBC: 4.93 Mil/uL (ref 4.22–5.81)
RDW: 13.7 % (ref 11.5–15.5)
WBC: 4.5 10*3/uL (ref 4.0–10.5)

## 2022-12-14 LAB — HEMOGLOBIN A1C: Hgb A1c MFr Bld: 6 % (ref 4.6–6.5)

## 2022-12-14 MED ORDER — VALSARTAN 320 MG PO TABS: 320.0000 mg | ORAL_TABLET | Freq: Every day | ORAL | 3 refills | Status: DC

## 2022-12-14 NOTE — Progress Notes (Signed)
   Subjective:   Patient ID: Nathan Bradford, male    DOB: August 08, 1991, 32 y.o.   MRN: [id number]  HPI The patient is a 32 YO man coming in for ongoing concerns of dizziness, headaches high BP. Started back on BP meds amlodipine and indapamide without reduction in BP about 3-4 weeks ago.  Review of Systems  Constitutional: Negative.   HENT:  Positive for sore throat.   Eyes: Negative.   Respiratory:  Negative for cough, chest tightness and shortness of breath.   Cardiovascular:  Negative for chest pain, palpitations and leg swelling.  Gastrointestinal:  Negative for abdominal distention, abdominal pain, constipation, diarrhea, nausea and vomiting.  Musculoskeletal: Negative.   Skin: Negative.   Neurological:  Positive for dizziness and headaches.  Psychiatric/Behavioral: Negative.      Objective:  Physical Exam Constitutional:      Appearance: He is well-developed. He is obese.  HENT:     Head: Normocephalic and atraumatic.  Cardiovascular:     Rate and Rhythm: Normal rate and regular rhythm.  Pulmonary:     Effort: Pulmonary effort is normal. No respiratory distress.     Breath sounds: Normal breath sounds. No wheezing or rales.  Abdominal:     General: Bowel sounds are normal. There is no distension.     Palpations: Abdomen is soft.     Tenderness: There is no abdominal tenderness. There is no rebound.  Musculoskeletal:     Cervical back: Normal range of motion.  Skin:    General: Skin is warm and dry.  Neurological:     Mental Status: He is alert and oriented to person, place, and time.     Coordination: Coordination normal.     Vitals:   12/14/22 1053 12/14/22 1101  BP: (!) 200/142 (!) 200/142  Pulse: 80   Temp: 98.9 F (37.2 C)   TempSrc: Oral   SpO2: 98%   Weight: 272 lb (123.4 kg)   Height: 6' (1.829 m)     Assessment & Plan:  Visit time 20 minutes in face to face communication with patient and coordination of care, additional 10 minutes spent in record  review, coordination or care, ordering tests, communicating/referring to other healthcare professionals, documenting in medical records all on the same day of the visit for total time 30 minutes spent on the visit.

## 2022-12-14 NOTE — Assessment & Plan Note (Signed)
Checking HgA1c and having new thirst, frequent urination lately.

## 2022-12-14 NOTE — Patient Instructions (Addendum)
We will keep the indapamide to take daily and ADD valsartan to take 1 pill daily. The high blood pressure could be causing the dizziness. Keep taking amlodipine as well.  We are checking the labs today.

## 2022-12-14 NOTE — Assessment & Plan Note (Signed)
He did fill meds amlodipine 10 mg daily and indapamide 2.5 mg daily about 3-4 weeks ago. No improvement in BP. Suspect he has at least HTN urgency today with headache, dizziness. Adding valsartan 320 mg daily to current regimen and follow up 1-2 weeks. Checking CMP and CBC today.

## 2022-12-16 LAB — ZINC: Zinc: 64 ug/dL (ref 60–130)

## 2022-12-17 ENCOUNTER — Telehealth: Payer: Self-pay | Admitting: Internal Medicine

## 2022-12-17 ENCOUNTER — Other Ambulatory Visit: Payer: Self-pay | Admitting: Internal Medicine

## 2022-12-17 DIAGNOSIS — R7303 Prediabetes: Secondary | ICD-10-CM

## 2022-12-17 NOTE — Telephone Encounter (Signed)
Patient needs a note saying that he can go back to work and that he is able to lift up to 45 lbs at a time.  Please call patient and let him know:  [phone]

## 2022-12-17 NOTE — Telephone Encounter (Signed)
Ok for note 

## 2022-12-20 NOTE — Telephone Encounter (Signed)
Sent patient a work not through my chart

## 2022-12-27 ENCOUNTER — Ambulatory Visit: Payer: BC Managed Care – PPO | Admitting: Skilled Nursing Facility1

## 2023-01-04 ENCOUNTER — Emergency Department (HOSPITAL_COMMUNITY): Payer: BC Managed Care – PPO

## 2023-01-04 ENCOUNTER — Encounter (HOSPITAL_COMMUNITY): Payer: Self-pay

## 2023-01-04 ENCOUNTER — Other Ambulatory Visit: Payer: Self-pay

## 2023-01-04 ENCOUNTER — Emergency Department (HOSPITAL_COMMUNITY)
Admission: EM | Admit: 2023-01-04 | Discharge: 2023-01-04 | Disposition: A | Discharge status: Home / Self Care | Payer: BC Managed Care – PPO | Attending: Emergency Medicine | Admitting: Emergency Medicine

## 2023-01-04 ENCOUNTER — Ambulatory Visit (INDEPENDENT_AMBULATORY_CARE_PROVIDER_SITE_OTHER): Payer: BC Managed Care – PPO | Admitting: Internal Medicine

## 2023-01-04 ENCOUNTER — Encounter: Payer: Self-pay | Admitting: Internal Medicine

## 2023-01-04 VITALS — BP 180/120 | HR 86 | Temp 98.0°F | Ht 72.0 in | Wt 275.0 lb

## 2023-01-04 DIAGNOSIS — Z79899 Other long term (current) drug therapy: Secondary | ICD-10-CM | POA: Insufficient documentation

## 2023-01-04 DIAGNOSIS — R9431 Abnormal electrocardiogram [ECG] [EKG]: Secondary | ICD-10-CM

## 2023-01-04 DIAGNOSIS — R0789 Other chest pain: Secondary | ICD-10-CM | POA: Insufficient documentation

## 2023-01-04 DIAGNOSIS — E785 Hyperlipidemia, unspecified: Secondary | ICD-10-CM | POA: Diagnosis present

## 2023-01-04 DIAGNOSIS — F3177 Bipolar disorder, in partial remission, most recent episode mixed: Secondary | ICD-10-CM

## 2023-01-04 DIAGNOSIS — F319 Bipolar disorder, unspecified: Secondary | ICD-10-CM | POA: Diagnosis present

## 2023-01-04 DIAGNOSIS — I10 Essential (primary) hypertension: Secondary | ICD-10-CM | POA: Diagnosis not present

## 2023-01-04 DIAGNOSIS — E119 Type 2 diabetes mellitus without complications: Secondary | ICD-10-CM | POA: Insufficient documentation

## 2023-01-04 DIAGNOSIS — J45909 Unspecified asthma, uncomplicated: Secondary | ICD-10-CM | POA: Diagnosis not present

## 2023-01-04 DIAGNOSIS — K219 Gastro-esophageal reflux disease without esophagitis: Secondary | ICD-10-CM | POA: Diagnosis present

## 2023-01-04 DIAGNOSIS — E876 Hypokalemia: Secondary | ICD-10-CM | POA: Insufficient documentation

## 2023-01-04 DIAGNOSIS — I160 Hypertensive urgency: Secondary | ICD-10-CM

## 2023-01-04 LAB — RAPID URINE DRUG SCREEN, HOSP PERFORMED
Amphetamines: NOT DETECTED
Barbiturates: NOT DETECTED
Benzodiazepines: NOT DETECTED
Cocaine: NOT DETECTED
Opiates: NOT DETECTED
Tetrahydrocannabinol: NOT DETECTED

## 2023-01-04 LAB — BASIC METABOLIC PANEL
Anion gap: 9 (ref 5–15)
BUN: 11 mg/dL (ref 6–20)
CO2: 28 mmol/L (ref 22–32)
Calcium: 9 mg/dL (ref 8.9–10.3)
Chloride: 100 mmol/L (ref 98–111)
Creatinine, Ser: 1.22 mg/dL (ref 0.61–1.24)
GFR, Estimated: >60 mL/min (ref >60)
Glucose, Bld: 81 mg/dL (ref 70–99)
Potassium: 3.4 mmol/L — ABNORMAL LOW (ref 3.5–5.1)
Sodium: 137 mmol/L (ref 135–145)

## 2023-01-04 LAB — CBC
HCT: 45 % (ref 39.0–52.0)
Hemoglobin: 14.4 g/dL (ref 13.0–17.0)
MCH: 28.7 pg (ref 26.0–34.0)
MCHC: 32 g/dL (ref 30.0–36.0)
MCV: 89.8 fL (ref 80.0–100.0)
Platelets: 296 10*3/uL (ref 150–400)
RBC: 5.01 MIL/uL (ref 4.22–5.81)
RDW: 13.7 % (ref 11.5–15.5)
WBC: 4.7 10*3/uL (ref 4.0–10.5)
nRBC: 0 % (ref 0.0–0.2)

## 2023-01-04 LAB — TSH: TSH: 0.873 u[IU]/mL (ref 0.350–4.500)

## 2023-01-04 LAB — TROPONIN I (HIGH SENSITIVITY)
Troponin I (High Sensitivity): 21 ng/L — ABNORMAL HIGH (ref <18)
Troponin I (High Sensitivity): 20 ng/L — ABNORMAL HIGH (ref <18)
Troponin I (High Sensitivity): 23 ng/L — ABNORMAL HIGH (ref <18)

## 2023-01-04 MED ORDER — HYDRALAZINE HCL 20 MG/ML IJ SOLN
10.0000 mg | Freq: Once | INTRAMUSCULAR | Status: AC
  Administered 2023-01-04: 10 mg via INTRAVENOUS
  Filled 2023-01-04: qty 1

## 2023-01-04 MED ORDER — POTASSIUM CHLORIDE CRYS ER 20 MEQ PO TBCR
20.0000 meq | EXTENDED_RELEASE_TABLET | Freq: Once | ORAL | Status: AC
  Administered 2023-01-04: 20 meq via ORAL
  Filled 2023-01-04: qty 1

## 2023-01-04 MED ORDER — LORAZEPAM 2 MG/ML IJ SOLN
0.5000 mg | Freq: Once | INTRAMUSCULAR | Status: AC
  Administered 2023-01-04: 0.5 mg via INTRAVENOUS
  Filled 2023-01-04: qty 1

## 2023-01-04 MED ORDER — ASPIRIN 325 MG PO TABS
325.0000 mg | ORAL_TABLET | Freq: Every day | ORAL | Status: DC
  Administered 2023-01-04: 325 mg via ORAL
  Filled 2023-01-04: qty 1

## 2023-01-04 MED ORDER — HYDRALAZINE HCL 20 MG/ML IJ SOLN: 5.0000 mg | Freq: Once | INTRAMUSCULAR | Status: DC

## 2023-01-04 MED ORDER — METOPROLOL TARTRATE 25 MG PO TABS
50.0000 mg | ORAL_TABLET | Freq: Two times a day (BID) | ORAL | Status: DC
  Administered 2023-01-04: 50 mg via ORAL
  Filled 2023-01-04: qty 2

## 2023-01-04 MED ORDER — NITROGLYCERIN IN D5W 200-5 MCG/ML-% IV SOLN
0.0000 ug/min | INTRAVENOUS | Status: DC
  Administered 2023-01-04: 5 ug/min via INTRAVENOUS
  Filled 2023-01-04: qty 250

## 2023-01-04 MED ORDER — ACETAMINOPHEN 325 MG PO TABS
650.0000 mg | ORAL_TABLET | Freq: Once | ORAL | Status: AC
  Administered 2023-01-04: 650 mg via ORAL
  Filled 2023-01-04: qty 2

## 2023-01-04 NOTE — Consult Note (Signed)
See consult note from Dr. Virgina Jock.   No charge  [place], [place], [facility]  Pager: [phone] Office: [phone]

## 2023-01-04 NOTE — Assessment & Plan Note (Signed)
He is not on controller medicine and is having possible hypomania currently with sleeping about 2 hours several days without tiredness. He is currently starting to feel tired. Given the urgency of his hypertension it is appropriate to discuss in near future.

## 2023-01-04 NOTE — ED Provider Notes (Signed)
  Physical Exam  BP (!) 169/111   Pulse 73   Temp 98.5 F (36.9 C) (Oral)   Resp (!) 26   Ht 1.829 m (6')   Wt 120.2 kg   SpO2 100%   BMI 35.94 kg/m   Physical Exam  Procedures  Procedures  ED Course / MDM   Clinical Course as of 01/04/23 2008  Tue Jan 04, 2023  1303 Unable to view EKGs in Muse [HN]  1433 CBC wnl [HN]  4497 Basic metabolic panel(!) Slightly hypokalemic, will replete, unremarkable [HN]  1557 BP(!): 169/111 Patient's BP decreased without intervention [HN]  1557 DG Chest 2 View 1. Mild cardiomegaly. 2. No acute pulmonary process.   [HN]  2001 BP(!): 186/129 [DR]    Clinical Course User Index [DR] Pattricia Boss, MD [HN] Audley Hose, MD   Medical Decision Making Amount and/or Complexity of Data Reviewed Labs: ordered. Decision-making details documented in ED Course. Radiology: ordered. Decision-making details documented in ED Course.  Risk OTC drugs. Prescription drug management.   32 yo male presented from [place] office with report of abnormal EKG, ho hypertension did not take bp meds this am.  He began having chest pain on drive to ED and has diffuse t wave inversion previously present. Hear score 3, awaiting repeat trop  Patient counseled by Dr.Naasz regarding taking bp meds Troponin are 21 and 20 Patient continues hypertensive with blood pressure 181/118 Hydralazine is ordered [facility] consult to cardiology and consider admission for pretense of urgency, EKG changes, and elevated troponins Reviewed echo and rest test February 2020 with EF 55% no T wave inversion noted during stress no ST segment deviation noted.  Normal wall motion reported increased LV tracer uptake suggestive of LVH reportedly low risk study Discussed with Dr. Terri Skains, on-call for cardiology Advises began nitro drip, plan admission to trend troponin Cardiology will see in consult Hospitalist paged Suspect troponin leak from hypertensive urgency Patient now complaining of  worsening chest pain.  Dr. Earnie Larsson saw in consult and feels likely hypertensive emergency BP still 187/118 Will d/c nitro Third troponin is 21 I have discussed the care with medicine Discussed symptoms with the patient and he is improved and has no chest pain at this time I feel that likely his symptoms were due to not taking his blood pressure medicine this morning.  We have discussed taking his medication on a regular basis and he needs to have his blood pressure rechecked tomorrow as outpatient      Pattricia Boss, MD 01/05/23 [phone]

## 2023-01-04 NOTE — Progress Notes (Signed)
Reached out to the Dr. Jeanell Sparrow from ED to clarify his disposition.  She informs me that patient was discharged with close follow up.   I will have the office reach out to make a follow up visit.   Rex Kras, [place], [facility]  Pager: [phone] Office: [phone]

## 2023-01-04 NOTE — ED Notes (Signed)
Patient transported to X-ray 

## 2023-01-04 NOTE — Assessment & Plan Note (Signed)
He is currently taking amlodipine 10 mg daily and indapamide 2.5 mg daily and did not start valsartan which we had prescribed at last visit early Jan 2024 due to feeling poorly and not wanting to start a new medicine. EKG done with new changes and he will go to ER and drive himself. We offered EMS and he declines.

## 2023-01-04 NOTE — Discharge Instructions (Addendum)
You were evaluated here in the emergency department for an abnormal EKG and chest pain. Your blood pressure was significantly elevated.  That was managed and controlled here in the emergency department with medications. Please take your normal home medications when you get home and take them as prescribed to avoid having repeat hypertensive urgency's.

## 2023-01-04 NOTE — ED Provider Notes (Signed)
[facility] Provider Note   CSN: [id number] Arrival date & time: 01/04/23  1226     History  Chief Complaint  Patient presents with   Hypertension   Abnormal ECG    Nathan Bradford is a 32 y.o. male with HTN, bipolar disorder, GAD, B12 deficiency, prediabetes, HLD who presents with HTN, abnormal EKG.   Patient went to his primary care doctor this morning in his normal state of health completely asymptomatic and was noted to have an abnormal EKG so sent to the emergency department.  He prior to that was asymptomatic but on the way to the emergency department began having chest tightness in the vehicle at the thought of possibly having a heart attack he became very anxious.  He currently denies any chest pain and states he feels "fine and generally happy" but endorses mild chest tightness.  Denies any shortness of breath, nausea vomiting, diaphoresis, lower extremity edema.  Denies any recent illnesses, sick contacts, cough, fever/chills.  Otherwise has been in his normal state of health.  Patient states he thinks he has been dehydrated lately so did not take his home blood pressure medications for fear of making himself more dehydrated.  Hypertension       Home Medications Prior to Admission medications   Medication Sig Start Date End Date Taking? Authorizing Provider  albuterol (VENTOLIN HFA) 108 (90 Base) MCG/ACT inhaler Inhale 2 puffs into the lungs every 6 (six) hours as needed for wheezing or shortness of breath. 11/16/22   Hoyt Koch, MD  ALPRAZolam Duanne Moron) 0.25 MG tablet Take 1 tablet (0.25 mg total) by mouth daily as needed for anxiety. 11/16/22   Hoyt Koch, MD  amLODipine (NORVASC) 10 MG tablet Take 1 tablet (10 mg total) by mouth daily. 11/16/22   Hoyt Koch, MD  atorvastatin (LIPITOR) 20 MG tablet Take 1 tablet (20 mg total) by mouth daily. 11/16/22   Hoyt Koch, MD  indapamide (LOZOL) 2.5 MG  tablet Take 1 tablet (2.5 mg total) by mouth daily. 11/16/22   Hoyt Koch, MD  loratadine (CLARITIN) 10 MG tablet Take 1 tablet (10 mg total) by mouth daily. 11/16/22   Hoyt Koch, MD  valsartan (DIOVAN) 320 MG tablet Take 1 tablet (320 mg total) by mouth daily. 12/14/22   Hoyt Koch, MD  Vitamin D, Ergocalciferol, (DRISDOL) 1.25 MG (50000 UNIT) CAPS capsule Take 50,000 Units by mouth once a week. 11/16/22   [provider]      Allergies    Chlorthalidone and Lisinopril    Review of Systems   Review of Systems Review of systems Negative for SOB, F/c, nausea.  A 10 point review of systems was performed and is negative unless otherwise reported in HPI.  Physical Exam Updated Vital Signs BP (!) 169/111   Pulse 73   Temp 98.5 F (36.9 C) (Oral)   Resp (!) 26   Ht 6' (1.829 m)   Wt 120.2 kg   SpO2 100%   BMI 35.94 kg/m  Physical Exam General: Normal appearing male, lying in bed.  HEENT: PERRLA, Sclera anicteric, MMM, trachea midline.  Cardiology: RRR, no murmurs/rubs/gallops. BL radial and DP pulses equal bilaterally.  Resp: Normal respiratory rate and effort. CTAB, no wheezes, rhonchi, crackles.  Abd: Soft, non-tender, non-distended. No rebound tenderness or guarding.  GU: Deferred. MSK: No peripheral edema or signs of trauma. Extremities without deformity or TTP. No cyanosis or clubbing. Skin: warm, dry.  No rashes or lesions. Back: No CVA tenderness Neuro: A&Ox4, CNs II-XII grossly intact. MAEs. Sensation grossly intact.  Psych: Normal mood and affect.   ED Results / Procedures / Treatments   Labs (all labs ordered are listed, but only abnormal results are displayed) Labs Reviewed  BASIC METABOLIC PANEL - Abnormal; Notable for the following components:      Result Value   Potassium 3.4 (*)    All other components within normal limits  TROPONIN I (HIGH SENSITIVITY) - Abnormal; Notable for the following components:   Troponin I (High  Sensitivity) 21 (*)    All other components within normal limits  CBC  TROPONIN I (HIGH SENSITIVITY)    EKG EKG Interpretation  Date/Time:  Tuesday January 04 2023 12:56:48 EST Ventricular Rate:  79 PR Interval:  184 QRS Duration: 102 QT Interval:  394 QTC Calculation: 451 R Axis:   112 Text Interpretation: Normal sinus rhythm Right axis deviation Unchanged T wave changes from prior EKGs J point elevation in anterior leads Confirmed by Cindee Lame [phone]) on 01/04/2023 1:16:14 PM  Radiology DG Chest 2 View  Result Date: 01/04/2023 CLINICAL DATA:  Chest pain, abnormal EKG EXAM: CHEST - 2 VIEW COMPARISON:  10/20/2020 FINDINGS: Mild cardiomegaly. Unchanged mediastinal contours. Mildly increased interstitial prominence without frank pulmonary edema. No pleural effusion or pneumothorax. No acute osseous abnormality. IMPRESSION: 1. Mild cardiomegaly. 2. No acute pulmonary process. Electronically Signed   By: Merilyn Baba M.D.   On: 01/04/2023 15:03    Procedures Procedures    Medications Ordered in ED Medications  potassium chloride SA (KLOR-CON M) CR tablet 20 mEq (has no administration in time range)    ED Course/ Medical Decision Making/ A&P                          Medical Decision Making Amount and/or Complexity of Data Reviewed Labs: ordered. Decision-making details documented in ED Course. Radiology: ordered.    MDM:    This patient presents to the ED for concern of EKG changes, chest tightness; this involves an extensive number of treatment options, and is a complaint that carries with it a high risk of complications and morbidity.  I considered the following differential and admission for this acute, potentially life threatening condition. However, patient is very well-appearing and non-toxic. He is hypertensive to 197/129 on arrival but did not take his home BP meds today, will CTM.   DDX for chest pain includes but is not limited to:  ACS/arrhythmia,  PE, aortic  dissection, PNA, PTX, pericarditis, GERD/PUD/gastritis, or musculoskeletal pain. Very low suspicion for ACS vs aortic dissection given presenting sx, as patient was essentially asymptomatic until his PCP told him he may be having a heart attack. Patient PERCs out for PE, minimal risk factors for PE. No c/f dissection. No abdominal pain and no c/f biliary disease. Consider GERD, given known history. Patient's EKG changes appear to be chronic, including TWIs, will obtain troponin to evaluate for ACS.    Clinical Course as of 01/04/23 1602  Tue Jan 04, 2023  1303 Unable to view EKGs in Muse [HN]  1433 CBC wnl [HN]  3244 Basic metabolic panel(!) Slightly hypokalemic, will replete, unremarkable [HN]  1557 BP(!): 169/111 Patient's BP decreased without intervention [HN]  1557 DG Chest 2 View 1. Mild cardiomegaly. 2. No acute pulmonary process.   [HN]    Clinical Course User Index [HN] Audley Hose, MD    Labs: I  Ordered, and personally interpreted labs.  The pertinent results include:  those listed above  Imaging Studies ordered: I ordered imaging studies including CXR I independently visualized and interpreted imaging. I agree with the radiologist interpretation  Additional history obtained from chart review.   Cardiac Monitoring: The patient was maintained on a cardiac monitor.  I personally viewed and interpreted the cardiac monitored which showed an underlying rhythm of: NSR  Reevaluation: After the interventions noted above, I reevaluated the patient and found that they have :improved  Social Determinants of Health: Patient lives independently   Disposition: HEART score 3. Patient is signed out to the oncoming ED physician who is made aware of his history, presentation, exam, workup, and plan. Plan is to obtain repeat troponin. If flat, patient can be discharged with PCP f/u.    Co morbidities that complicate the patient evaluation  Past Medical History:  Diagnosis  Date   Anxiety    Asthma    Phreesia 09/23/2020   Bipolar 1 disorder ([place])    Depression    Depression    Phreesia 09/23/2020   Diabetes mellitus without complication ([place])    Phreesia 09/23/2020   Hypertension    Phreesia 09/23/2020   Vertigo      Medicines Meds ordered this encounter  Medications   DISCONTD: hydrALAZINE (APRESOLINE) injection 5 mg   potassium chloride SA (KLOR-CON M) CR tablet 20 mEq    I have reviewed the patients home medicines and have made adjustments as needed  Problem List / ED Course: Problem List Items Addressed This Visit   None Visit Diagnoses     Chest tightness    -  Primary                   This note was created using dictation software, which may contain spelling or grammatical errors.    Audley Hose, MD 01/04/23 [phone]

## 2023-01-04 NOTE — Assessment & Plan Note (Addendum)
Prior V1 and V2 ST changes which are stable today. New V3 and V4 changes which indicate hypertensive urgency. He is advised to go to ER and he is going to drive himself. We have advised EMS but he declines due to cost. He is given a copy of EKG to carry with him to ER advised to go to [facility] ER

## 2023-01-04 NOTE — ED Triage Notes (Signed)
Pt arrived POV from the drs office c/o HTN and an abnormal EKG. Pt states the dr told him he might be having a heart attack. Pt states he started having chest tightness on the way here but he thinks that is d/t the stress and thinking he is now having a heart attack.

## 2023-01-04 NOTE — Consult Note (Signed)
CARDIOLOGY CONSULT NOTE  Patient ID: Nathan Bradford MRN: [id number] DOB/AGE: June 21, 1991 32 y.o.  Admit date: 01/04/2023 Referring Physician: [facility] Reason for Consultation:  Chest pain  HPI:   32 y.o. African American male  with hypertension, depression, bipolar disorder 1, presented with abnormal EKG.  Patient was seeing his PCP today and was found to have abnormal EKG. At that time, he ddi not report any chest pain. He has been hypertensive in the ER, started experiencing headache and dizziness after being started on nitroglycerin for chest discomfort, that has since resolved. Trop HS 21, 20.   Patient has long standing h/o hypertension. He  is unable to tell me the medications he takes for hypertension. He is under a lot of stress working two jobs, caring for his daughter.   Past Medical History:  Diagnosis Date   Anxiety    Asthma    Phreesia 09/23/2020   Bipolar 1 disorder ([place])    Depression    Depression    Phreesia 09/23/2020   Diabetes mellitus without complication ([place])    Phreesia 09/23/2020   Hypertension    Phreesia 09/23/2020   Vertigo      Past Surgical History:  Procedure Laterality Date   WISDOM TOOTH EXTRACTION        Family History  Problem Relation Age of Onset   Hypertension Mother    Hypertension Father      Social History: Social History   Socioeconomic History   Marital status: Single    Spouse name: Not on file   Number of children: Not on file   Years of education: Not on file   Highest education level: Not on file  Occupational History   Not on file  Tobacco Use   Smoking status: Never   Smokeless tobacco: Never  Vaping Use   Vaping Use: Never used  Substance and Sexual Activity   Alcohol use: No   Drug use: No   Sexual activity: Not on file  Other Topics Concern   Not on file  Social History Narrative   Not on file   Social Determinants of Health   Financial Resource Strain: Not on file  Food Insecurity: Not on  file  Transportation Needs: Not on file  Physical Activity: Not on file  Stress: Not on file  Social Connections: Not on file  Intimate Partner Violence: Not on file     (Not in a hospital admission)   Review of Systems  Cardiovascular:  Negative for chest pain, dyspnea on exertion, leg swelling, palpitations and syncope.  Neurological:  Positive for dizziness and headaches.      Physical Exam: Physical Exam Vitals and nursing note reviewed.  Constitutional:      General: He is not in acute distress. Neck:     Vascular: No JVD.  Cardiovascular:     Rate and Rhythm: Normal rate and regular rhythm.     Heart sounds: Normal heart sounds. No murmur heard. Pulmonary:     Effort: Pulmonary effort is normal.     Breath sounds: Normal breath sounds. No wheezing or rales.  Musculoskeletal:     Right lower leg: No edema.     Left lower leg: No edema.        Imaging/tests reviewed and independently interpreted: Lab Results: CBC, BMP, trop HS  Cardiac Studies:  Telemetry 01/04/2023: No signifciant arrhythmia   EKG 01/04/2023: Sinus rhythm Inferolateral T wave inversion, s/o LV strain-unchanged compared to previous EKGs   Stress test  02/07/2019: Nuclear stress EF: 55%. Blood pressure demonstrated a normal response to exercise. No T wave inversion was noted during stress. There was no ST segment deviation noted during stress. This is a low risk study.   Normal perfusion. LVEF 55% with normal wall motion. Increased LV tracer uptake suggestive of LVH. Mild RV uptake suggests elevated PAP. This is a low risk study.   Echocardiogram 01/29/2019:  1. The left ventricle has normal systolic function with an ejection  fraction of 60-65%. The cavity size was normal. Left ventricular diastolic  parameters were normal No evidence of left ventricular regional wall  motion abnormalities.   2. The right ventricle has normal systolic function. The cavity was  normal. There is no  increase in right ventricular wall thickness.   3. Trivial pericardial effusion is present.   4. The mitral valve is normal in structure.   5. The tricuspid valve is normal in structure.   6. The aortic valve is tricuspid.   7. The pulmonic valve was normal in structure.   8. No evidence of left ventricular regional wall motion abnormalities.   9. Right atrial pressure is estimated at 3 mmHg.    Assessment & Recommendations:  32 y.o. African American male  with hypertension, depression, bipolar disorder 1, presented with abnormal EKG.  Abnormal EKG: Chronic EG changess secondary to uncontrolled hypertension. No acute changes to suggest ACS. Trop HS 21. 20, 23. Mild elevation and flat trend not indicative of ACS. Trop elevation likely 2/2 hypertension. Given his risk factors for CAD, precordial pain that has since resolved, reasonable to consider iscehmia testing. This can either be nuclear stress test or CCTA. I will check CCTa availability for tomorrow am and confirm.  Hypertension: Uncontrolled. Given hypokalemia, young age, strongly suspect secondary hypertension-possible primary hyperaldosteronism. Check renin/aldosterone. If negative, can check renal artery duplex.  He is already on valsartan at home, which could potentially affect the test, but worth checking. After checking these labs, consider adding spironolactone. For bow, could resume home amlodipine, valsartan, and add labetalol 200 mg bud or Bidil 20-37.5 mg tid, if blood pressure remains uncontrolled.    Could consider obs admission with hospitalist.   Discussed interpretation of tests and management recommendations with the primary team     Nigel Mormon, MD Pager: [phone] Office: [phone]

## 2023-01-04 NOTE — Progress Notes (Signed)
   Subjective:   Patient ID: Nathan Bradford, male    DOB: 05-20-1991, 32 y.o.   MRN: [id number]  HPI The patient is a 32 YO man coming in for high BP. Did not start valsartan as he felt dehydrated and did not want to take it. Is taking amlodipine 10 mg daily and indapamide 2.5 mg daily. Feels dry mouth, dizziness, headaches, some chest tightness and feels like his heart is a balloon. Also having problems sleeping and slept 2 hours last 2 nights and did not feel tired felt more energized.   Review of Systems  Constitutional:  Positive for activity change, appetite change and fatigue.  HENT: Negative.    Eyes: Negative.   Respiratory:  Positive for chest tightness. Negative for cough and shortness of breath.   Cardiovascular:  Positive for chest pain and palpitations. Negative for leg swelling.  Gastrointestinal:  Negative for abdominal distention, abdominal pain, constipation, diarrhea, nausea and vomiting.  Musculoskeletal: Negative.   Skin: Negative.   Neurological:  Positive for weakness and headaches.  Psychiatric/Behavioral:  Positive for decreased concentration, dysphoric mood and sleep disturbance. The patient is nervous/anxious.     Objective:  Physical Exam Constitutional:      Appearance: He is well-developed. He is ill-appearing.  HENT:     Head: Normocephalic and atraumatic.  Cardiovascular:     Rate and Rhythm: Normal rate and regular rhythm.  Pulmonary:     Effort: Pulmonary effort is normal. No respiratory distress.     Breath sounds: Normal breath sounds. No wheezing or rales.  Abdominal:     General: Bowel sounds are normal. There is no distension.     Palpations: Abdomen is soft.     Tenderness: There is no abdominal tenderness. There is no rebound.  Musculoskeletal:        General: Tenderness present.     Cervical back: Normal range of motion.  Skin:    General: Skin is warm and dry.  Neurological:     Mental Status: He is alert and oriented to person, place,  and time.     Coordination: Coordination normal.     Vitals:   01/04/23 1023  BP: (!) 180/120  Pulse: 86  Temp: 98 F (36.7 C)  TempSrc: Oral  SpO2: 98%  Weight: 275 lb (124.7 kg)  Height: 6' (1.829 m)   EKG: Rate 79, axis left, interval QTc 460 prior 425, sinus, new st or t wave changes with prior elevation at V1 and V2, new ST elevation V3 and V4, there is significant change compared to prior 2023  Assessment & Plan:  Visit time 30 minutes in face to face communication with patient and coordination of care, additional 10 minutes spent in record review, coordination or care, ordering tests, communicating/referring to other healthcare professionals, documenting in medical records all on the same day of the visit for total time 40 minutes spent on the visit.

## 2023-01-04 NOTE — Assessment & Plan Note (Addendum)
BP is severely high so EKG done. He is having headaches, dizziness, chest pressure (feels like heart is a balloon). He did not start new BP medication last visit valsartan so is taking only amlodipine 10 mg daily and indapamide 2.5 mg daily. His EKG reveals new ST elevation in V3 and V4 (prior inversion t waves now ST elevation does not meet criteria). Counseled to go to ER via EMS and he is concerned about cost and he job. He was able to call job however declines EMS ride. He will drive self to [facility] now and given copy of EKG.

## 2023-01-05 ENCOUNTER — Telehealth: Payer: Self-pay

## 2023-01-05 NOTE — Telephone Encounter (Signed)
Transition Care Management Follow-up Telephone Call Date of discharge and from where: [place] [facility] 01-04-23 Dx: chest tightness How have you been since you were released from the hospital? Doing somewhat better  Any questions or concerns? No  Items Reviewed: Did the pt receive and understand the discharge instructions provided? Yes  Medications obtained and verified? Yes  Other? No  Any new allergies since your discharge? No  Dietary orders reviewed? Yes Do you have support at home? Yes   Home Care and Equipment/Supplies: Were home health services ordered? no If so, what is the name of the agency? na  Has the agency set up a time to come to the patient's home? not applicable Were any new equipment or medical supplies ordered?  No What is the name of the medical supply agency? na Were you able to get the supplies/equipment? not applicable Do you have any questions related to the use of the equipment or supplies? No  Functional Questionnaire: (I = Independent and D = Dependent) ADLs: I  Bathing/Dressing- I  Meal Prep- I  Eating- I  Maintaining continence- I  Transferring/Ambulation- I  Managing Meds- I  Follow up appointments reviewed:  PCP Hospital f/u appt confirmed? Yes  Scheduled to see Dr Sharlet Salina on 01-18-22 @ 340pm. [facility] f/u appt confirmed? Yes  Scheduled to see Dr Terri Skains on 01-25-23 @ 2pm. Are transportation arrangements needed? No  If their condition worsens, is the pt aware to call PCP or go to the Emergency Dept.? Yes Was the patient provided with contact information for the PCP's office or ED? Yes Was to pt encouraged to call back with questions or concerns? Yes   Juanda Crumble LPN [facility] [phone]

## 2023-01-18 ENCOUNTER — Inpatient Hospital Stay: Payer: BC Managed Care – PPO | Admitting: Internal Medicine

## 2023-01-25 ENCOUNTER — Ambulatory Visit: Payer: BC Managed Care – PPO | Admitting: Cardiology

## 2023-01-31 ENCOUNTER — Encounter: Payer: Self-pay | Admitting: Internal Medicine

## 2023-01-31 ENCOUNTER — Ambulatory Visit (INDEPENDENT_AMBULATORY_CARE_PROVIDER_SITE_OTHER): Payer: BC Managed Care – PPO | Admitting: Internal Medicine

## 2023-01-31 VITALS — BP 160/100 | HR 95 | Temp 98.2°F | Ht 72.0 in | Wt 270.0 lb

## 2023-01-31 DIAGNOSIS — I10 Essential (primary) hypertension: Secondary | ICD-10-CM

## 2023-01-31 DIAGNOSIS — F3177 Bipolar disorder, in partial remission, most recent episode mixed: Secondary | ICD-10-CM

## 2023-01-31 LAB — COMPREHENSIVE METABOLIC PANEL
ALT: 17 U/L (ref 0–53)
AST: 18 U/L (ref 0–37)
Albumin: 4.1 g/dL (ref 3.5–5.2)
Alkaline Phosphatase: 53 U/L (ref 39–117)
BUN: 12 mg/dL (ref 6–23)
CO2: 33 mEq/L — ABNORMAL HIGH (ref 19–32)
Calcium: 9.5 mg/dL (ref 8.4–10.5)
Chloride: 100 mEq/L (ref 96–112)
Creatinine, Ser: 1.27 mg/dL (ref 0.40–1.50)
GFR: 75.51 mL/min (ref >60.00)
Glucose, Bld: 92 mg/dL (ref 70–99)
Potassium: 3.3 mEq/L — ABNORMAL LOW (ref 3.5–5.1)
Sodium: 140 mEq/L (ref 135–145)
Total Bilirubin: 1.1 mg/dL (ref 0.2–1.2)
Total Protein: 7 g/dL (ref 6.0–8.3)

## 2023-01-31 LAB — CBC
HCT: 43.9 % (ref 39.0–52.0)
Hemoglobin: 14.7 g/dL (ref 13.0–17.0)
MCHC: 33.4 g/dL (ref 30.0–36.0)
MCV: 87.3 fl (ref 78.0–100.0)
Platelets: 297 10*3/uL (ref 150.0–400.0)
RBC: 5.02 Mil/uL (ref 4.22–5.81)
RDW: 14.6 % (ref 11.5–15.5)
WBC: 4.3 10*3/uL (ref 4.0–10.5)

## 2023-01-31 NOTE — Patient Instructions (Addendum)
We will check the labs today. 

## 2023-01-31 NOTE — Progress Notes (Unsigned)
   Subjective:   Patient ID: Nathan Bradford, male    DOB: [birth year], 32 y.o.   MRN: [id number]  HPI The patient is a 32 YO man coming in for follow up BP (in ER with accelerated HTN and urgency, meds adjusted and testing without indication for ACS). Is doing okay still some headaches and dizziness. Overall stable.  Review of Systems  Constitutional: Negative.   HENT: Negative.    Eyes: Negative.   Respiratory:  Negative for cough, chest tightness and shortness of breath.   Cardiovascular:  Negative for chest pain, palpitations and leg swelling.  Gastrointestinal:  Negative for abdominal distention, abdominal pain, constipation, diarrhea, nausea and vomiting.  Musculoskeletal: Negative.   Skin: Negative.   Neurological:  Positive for dizziness and headaches.  Psychiatric/Behavioral: Negative.      Objective:  Physical Exam Constitutional:      Appearance: He is well-developed.  HENT:     Head: Normocephalic and atraumatic.  Cardiovascular:     Rate and Rhythm: Normal rate and regular rhythm.  Pulmonary:     Effort: Pulmonary effort is normal. No respiratory distress.     Breath sounds: Normal breath sounds. No wheezing or rales.  Abdominal:     General: Bowel sounds are normal. There is no distension.     Palpations: Abdomen is soft.     Tenderness: There is no abdominal tenderness. There is no rebound.  Musculoskeletal:     Cervical back: Normal range of motion.  Skin:    General: Skin is warm and dry.  Neurological:     Mental Status: He is alert and oriented to person, place, and time.     Coordination: Coordination normal.     Vitals:   01/31/23 1337 01/31/23 1431  BP: (!) 178/100 (!) 160/100  Pulse: 95   Temp: 98.2 F (36.8 C)   TempSrc: Oral   SpO2: 99%   Weight: 270 lb (122.5 kg)   Height: 6' (1.829 m)     Assessment & Plan:  Visit time 20 minutes in face to face communication with patient and coordination of care, additional 10 minutes spent in record  review, coordination or care, ordering tests, communicating/referring to other healthcare professionals, documenting in medical records all on the same day of the visit for total time 30 minutes spent on the visit.

## 2023-02-01 ENCOUNTER — Ambulatory Visit: Payer: BC Managed Care – PPO | Admitting: Cardiology

## 2023-02-01 ENCOUNTER — Encounter: Payer: Self-pay | Admitting: Cardiology

## 2023-02-01 VITALS — BP 160/98 | HR 85 | Resp 16 | Ht 72.0 in | Wt 270.0 lb

## 2023-02-01 DIAGNOSIS — R7303 Prediabetes: Secondary | ICD-10-CM

## 2023-02-01 DIAGNOSIS — E876 Hypokalemia: Secondary | ICD-10-CM

## 2023-02-01 DIAGNOSIS — R072 Precordial pain: Secondary | ICD-10-CM

## 2023-02-01 DIAGNOSIS — I10 Essential (primary) hypertension: Secondary | ICD-10-CM

## 2023-02-01 MED ORDER — LABETALOL HCL 100 MG PO TABS: 100.0000 mg | ORAL_TABLET | Freq: Two times a day (BID) | ORAL | 0 refills | Status: DC

## 2023-02-01 NOTE — Progress Notes (Signed)
ID:  Nathan Bradford, DOB 10/01/1991, MRN [id number]  PCP:  Hoyt Koch, MD  Cardiologist:  Rex Kras, DO, [facility] (established care 02/01/2023)   Chief Complaint  Patient presents with   Chest Pain   Follow-up    2 week    HPI  Nathan Bradford is a 32 y.o. African-American male whose past medical history and cardiovascular risk factors include: Prediabetes, Bipolar 1, Panic attack, Depression, anxiety, obesity.  Patient presents today for evaluation of precordial pain.  The discomfort is located substernally, 3 out of 10, not brought on by effort related activities, does not resolve with rest, usually associated with his panic attacks, symptoms lasting anywhere from 30 minutes to all day long.  He had similar discomfort in 2020 at which time he had an echo and stress test done results reviewed and noted below for further reference.  He was recently seen in the ED by my partner in January 2024 and it was felt that his EKG changes were likely secondary to repolarization abnormality.  Overall intensity frequency and duration of chest pain have not changed but still present during his panic attacks.  Patient states that he was diagnosed with benign essential hypertension in 2019 and his blood pressures have been elevated since.  He does not check his blood pressures at home.  His diet also includes a significant amount of salt.  And has not been evaluated for sleep apnea.  No family history of premature coronary disease or sudden cardiac death.  FUNCTIONAL STATUS: No structured exercise program or daily routine. But is active a work.   ALLERGIES: Allergies  Allergen Reactions   Chlorthalidone     Hearing loss   Lisinopril Cough    MEDICATION LIST PRIOR TO VISIT: Current Meds  Medication Sig   albuterol (VENTOLIN HFA) 108 (90 Base) MCG/ACT inhaler Inhale 2 puffs into the lungs every 6 (six) hours as needed for wheezing or shortness of breath.   ALPRAZolam (XANAX) 0.25 MG  tablet Take 1 tablet (0.25 mg total) by mouth daily as needed for anxiety.   amLODipine (NORVASC) 10 MG tablet Take 1 tablet (10 mg total) by mouth daily.   atorvastatin (LIPITOR) 20 MG tablet Take 1 tablet (20 mg total) by mouth daily.   indapamide (LOZOL) 2.5 MG tablet Take 1 tablet (2.5 mg total) by mouth daily.   labetalol (NORMODYNE) 100 MG tablet Take 1 tablet (100 mg total) by mouth 2 (two) times daily.   loratadine (CLARITIN) 10 MG tablet Take 1 tablet (10 mg total) by mouth daily.   potassium chloride SA (KLOR-CON M) 20 MEQ tablet Take 1 tablet (20 mEq total) by mouth 2 (two) times daily.   valsartan (DIOVAN) 320 MG tablet Take 1 tablet (320 mg total) by mouth daily.   Vitamin D, Ergocalciferol, (DRISDOL) 1.25 MG (50000 UNIT) CAPS capsule Take 50,000 Units by mouth once a week.     PAST MEDICAL HISTORY: Past Medical History:  Diagnosis Date   Anxiety    Asthma    Phreesia 09/23/2020   Bipolar 1 disorder ([place])    Depression    Depression    Phreesia 09/23/2020   Diabetes mellitus without complication ([place])    Phreesia 09/23/2020   Hypertension    Phreesia 09/23/2020   Vertigo     PAST SURGICAL HISTORY: Past Surgical History:  Procedure Laterality Date   WISDOM TOOTH EXTRACTION      FAMILY HISTORY: The patient family history includes Hypertension in his father and mother;  Kidney disease in his father; Stroke in his mother.  SOCIAL HISTORY:  The patient  reports that he has never smoked. He has never used smokeless tobacco. He reports that he does not drink alcohol and does not use drugs.  REVIEW OF SYSTEMS: Review of Systems  Cardiovascular:  Positive for chest pain (chest tightness - once every 3-4 months.) and dyspnea on exertion. Negative for claudication, irregular heartbeat, leg swelling, near-syncope, orthopnea, palpitations, paroxysmal nocturnal dyspnea and syncope.  Respiratory:  Negative for shortness of breath.   Hematologic/Lymphatic: Negative for  bleeding problem.  Musculoskeletal:  Negative for muscle cramps and myalgias.  Neurological:  Negative for dizziness and light-headedness.    PHYSICAL EXAM:    02/01/2023    3:26 PM 02/01/2023    2:42 PM 02/01/2023    2:40 PM  Vitals with BMI  Height   6' 0"$   Weight   270 lbs  BMI   [id number]  Systolic 0000000 [id number]  Diastolic 98 [id number]  Pulse  85 83    Physical Exam  Constitutional: No distress.  Age appropriate, hemodynamically stable.   Neck: No JVD present.  Cardiovascular: Normal rate, regular rhythm, S1 normal, S2 normal, intact distal pulses and normal pulses. Exam reveals no gallop, no S3 and no S4.  No murmur heard. Pulmonary/Chest: Effort normal and breath sounds normal. No stridor. He has no wheezes. He has no rales.  Abdominal: Soft. Bowel sounds are normal. He exhibits no distension. There is no abdominal tenderness.  Musculoskeletal:        General: No edema.     Cervical back: Neck supple.  Neurological: He is alert and oriented to person, place, and time. He has intact cranial nerves (2-12).  Skin: Skin is warm and moist.   CARDIAC DATABASE: EKG: 02/01/2023: Sinus rhythm, 80 bpm, TWI in the lateral leads consider ischemia  Echocardiogram:  01/29/2019:  1. The left ventricle has normal systolic function with an ejection  fraction of 60-65%. The cavity size was normal. Left ventricular diastolic  parameters were normal No evidence of left ventricular regional wall  motion abnormalities.   2. The right ventricle has normal systolic function. The cavity was  normal. There is no increase in right ventricular wall thickness.   3. Trivial pericardial effusion is present.   4. The mitral valve is normal in structure.   5. The tricuspid valve is normal in structure.   6. The aortic valve is tricuspid.   7. The pulmonic valve was normal in structure.   8. No evidence of left ventricular regional wall motion abnormalities.   9. Right atrial pressure is estimated at 3  mmHg.   Stress Testing: Stress test 02/07/2019: Nuclear stress EF: 55%. Blood pressure demonstrated a normal response to exercise. No T wave inversion was noted during stress. There was no ST segment deviation noted during stress. This is a low risk study.   Normal perfusion. LVEF 55% with normal wall motion. Increased LV tracer uptake suggestive of LVH. Mild RV uptake suggests elevated PAP. This is a low risk study.   Heart Catheterization: None  LABORATORY DATA:    Latest Ref Rng & Units 01/31/2023    2:10 PM 01/04/2023    1:09 PM 12/14/2022   11:26 AM  CBC  WBC 4.0 - 10.5 K/uL 4.3  4.7  4.5   Hemoglobin 13.0 - 17.0 g/dL 14.7  14.4  14.4   Hematocrit 39.0 - 52.0 % 43.9  45.0  43.1   Platelets  150.0 - 400.0 K/uL 297.0  296  383.0        Latest Ref Rng & Units 01/31/2023    2:10 PM 01/04/2023    1:09 PM 12/14/2022   11:26 AM  CMP  Glucose 70 - 99 mg/dL 92  81  116   BUN 6 - 23 mg/dL 12  11  9   $ Creatinine 0.40 - 1.50 mg/dL 1.27  1.22  1.16   Sodium 135 - 145 mEq/L 140  137  141   Potassium 3.5 - 5.1 mEq/L 3.3  3.4  3.0   Chloride 96 - 112 mEq/L 100  100  102   CO2 19 - 32 mEq/L 33  28  31   Calcium 8.4 - 10.5 mg/dL 9.5  9.0  9.1   Total Protein 6.0 - 8.3 g/dL 7.0   6.9   Total Bilirubin 0.2 - 1.2 mg/dL 1.1   0.6   Alkaline Phos 39 - 117 U/L 53   64   AST 0 - 37 U/L 18   17   ALT 0 - 53 U/L 17   19     Lipid Panel  Lab Results  Component Value Date   CHOL 155 12/14/2022   HDL 39.70 12/14/2022   LDLCALC 102 (H) 12/14/2022   TRIG 66.0 12/14/2022   CHOLHDL 4 12/14/2022     No components found for: "NTPROBNP" No results for input(s): "PROBNP" in the last 8760 hours. Recent Labs    01/04/23 1755  TSH 0.873    BMP Recent Labs    12/14/22 1126 01/04/23 1309 01/31/23 1410  NA 141 137 140  K 3.0* 3.4* 3.3*  CL 102 100 100  CO2 31 28 33*  GLUCOSE 116* 81 92  BUN 9 11 12  $ CREATININE 1.16 1.22 1.27  CALCIUM 9.1 9.0 9.5  GFRNONAA  --  >60  --      HEMOGLOBIN A1C Lab Results  Component Value Date   HGBA1C 6.0 12/14/2022   MPG 108.28 12/08/2018    IMPRESSION:    ICD-10-CM   1. Benign hypertension  I10 labetalol (NORMODYNE) 100 MG tablet    Aldosterone + renin activity w/ ratio    Metanephrines, plasma    Catecholamines, fractionated, plasma    Metanephrines, Urine, 24 hour    5 HIAA, quantitative, Urine, 24 hour    Cortisol-am, blood    Ambulatory referral to Sleep Studies    PCV ECHOCARDIOGRAM COMPLETE    CT ABDOMEN PELVIS W WO CONTRAST    2. Precordial pain  R07.2 EKG 12-Lead    PCV ECHOCARDIOGRAM COMPLETE    3. Pre-diabetes  R73.03     4. Hypokalemia  E87.6 potassium chloride SA (KLOR-CON M) 20 MEQ tablet       RECOMMENDATIONS: Nathan Bradford is a 32 y.o. African-American male whose past medical history and cardiac risk factors include: Prediabetes, Bipolar 1, Panic attack, Depression, anxiety, obesity.  Benign hypertension Diagnosed in 2019, per patient. Patient states that his blood pressures are consistently elevated despite medical therapy. He is requesting assistance with blood pressure management. Start labetalol 100 mg p.o. twice daily. Would like to evaluate for secondary causes.  Given his young age. Check aldosterone and ratio -he is on ARB therefore overall accuracy will be reduced.  However, if still elevated could be helpful clinically. Plasma metanephrines. Fractionated metanephrines and normetanephrine's. Urine 24-hour 5H IAA. Morning cortisol. Notes to have hypokalemia despite being on ARB.  Reviewed the clinical suspicion for Conn syndrome  Refer to [facility] sleep medicine Dr. Reece Levy for evaluation for sleep apnea or sleep-related disorders. CT abdomen pelvis with and without contrast to evaluate for primary hyperaldosteronism/pheochromocytoma/adrenal nodules/hyperplasia/etc.   Precordial pain Predominantly none cardiac.   Similar discomfort back in 2020 when he underwent ischemic workup as  outlined above We will reconsider ischemic workup once blood pressure is better controlled. In the meantime we will proceed with an echocardiogram to evaluate for LVEF, structural heart disease, etc. Educated on seeking medical attention sooner by going to the closest ER via EMS if the symptoms increase in intensity, frequency, duration, or has typical chest pain as discussed in the office.  Patient verbalized understanding.  Pre-diabetes Management per primary team  Hypokalemia Will prescribe K-Lor 20 mEq x bid    FINAL MEDICATION LIST END OF ENCOUNTER: Meds ordered this encounter  Medications   labetalol (NORMODYNE) 100 MG tablet    Sig: Take 1 tablet (100 mg total) by mouth 2 (two) times daily.    Dispense:  60 tablet    Refill:  0   potassium chloride SA (KLOR-CON M) 20 MEQ tablet    Sig: Take 1 tablet (20 mEq total) by mouth 2 (two) times daily.    Dispense:  60 tablet    Refill:  0    There are no discontinued medications.   Current Outpatient Medications:    albuterol (VENTOLIN HFA) 108 (90 Base) MCG/ACT inhaler, Inhale 2 puffs into the lungs every 6 (six) hours as needed for wheezing or shortness of breath., Disp: 18 g, Rfl: 5   ALPRAZolam (XANAX) 0.25 MG tablet, Take 1 tablet (0.25 mg total) by mouth daily as needed for anxiety., Disp: 30 tablet, Rfl: 5   amLODipine (NORVASC) 10 MG tablet, Take 1 tablet (10 mg total) by mouth daily., Disp: 90 tablet, Rfl: 3   atorvastatin (LIPITOR) 20 MG tablet, Take 1 tablet (20 mg total) by mouth daily., Disp: 90 tablet, Rfl: 3   indapamide (LOZOL) 2.5 MG tablet, Take 1 tablet (2.5 mg total) by mouth daily., Disp: 90 tablet, Rfl: 3   labetalol (NORMODYNE) 100 MG tablet, Take 1 tablet (100 mg total) by mouth 2 (two) times daily., Disp: 60 tablet, Rfl: 0   loratadine (CLARITIN) 10 MG tablet, Take 1 tablet (10 mg total) by mouth daily., Disp: 30 tablet, Rfl: 11   potassium chloride SA (KLOR-CON M) 20 MEQ tablet, Take 1 tablet (20 mEq total)  by mouth 2 (two) times daily., Disp: 60 tablet, Rfl: 0   valsartan (DIOVAN) 320 MG tablet, Take 1 tablet (320 mg total) by mouth daily., Disp: 90 tablet, Rfl: 3   Vitamin D, Ergocalciferol, (DRISDOL) 1.25 MG (50000 UNIT) CAPS capsule, Take 50,000 Units by mouth once a week., Disp: , Rfl:   Orders Placed This Encounter  Procedures   CT ABDOMEN PELVIS W WO CONTRAST   Aldosterone + renin activity w/ ratio   Metanephrines, plasma   Catecholamines, fractionated, plasma   Metanephrines, Urine, 24 hour   5 HIAA, quantitative, Urine, 24 hour   Cortisol-am, blood   Ambulatory referral to Sleep Studies   EKG 12-Lead   PCV ECHOCARDIOGRAM COMPLETE    There are no Patient Instructions on file for this visit.   --Continue cardiac medications as reconciled in final medication list. --Return in about 4 weeks (around 03/01/2023) for Follow up, BP, Chest pain. or sooner if needed. --Continue follow-up with your primary care physician regarding the management of your other chronic comorbid conditions.  Patient's questions and  concerns were addressed to his satisfaction. He voices understanding of the instructions provided during this encounter.   This note was created using a voice recognition software as a result there may be grammatical errors inadvertently enclosed that do not reflect the nature of this encounter. Every attempt is made to correct such errors.  Rex Kras, [place], [facility]  Pager: [phone] Office: [phone]

## 2023-02-02 ENCOUNTER — Encounter: Payer: Self-pay | Admitting: Internal Medicine

## 2023-02-02 NOTE — Assessment & Plan Note (Signed)
He admits to having struggles with this but not wanting to take medication regularly. He feels that working on stress management will help more. Denies sleeping less or more than usual lately. He is now living alone and will not have to care for anyone other than himself and he is making his self care a priority and I commended him on this.

## 2023-02-02 NOTE — Assessment & Plan Note (Addendum)
Advised patient BP is still high and while it is lower he has signs of cardiomegaly on CXR and prior exam. ST changes on EKG which are chronic and needs good BP control. He defers to cardiology visit tomorrow. Checking CBC and CMP given he started valsartan about 3-4 weeks ago. Until cardiology will continue amlodipine 10 mg daily, indapamide 2.5 mg daily and valsartan 320 mg daily.

## 2023-02-05 MED ORDER — POTASSIUM CHLORIDE CRYS ER 20 MEQ PO TBCR: 20.0000 meq | EXTENDED_RELEASE_TABLET | Freq: Two times a day (BID) | ORAL | 0 refills | Status: DC

## 2023-02-07 ENCOUNTER — Other Ambulatory Visit: Payer: BC Managed Care – PPO

## 2023-02-11 ENCOUNTER — Other Ambulatory Visit: Payer: BC Managed Care – PPO

## 2023-02-18 ENCOUNTER — Telehealth: Payer: Self-pay | Admitting: Internal Medicine

## 2023-02-18 ENCOUNTER — Ambulatory Visit: Payer: BC Managed Care – PPO | Admitting: Internal Medicine

## 2023-02-18 NOTE — Telephone Encounter (Signed)
PT calls today in regards to getting a note from [place] stating PT's need for a service animal. PT's landlord requires this note and for it to state that it is recommended for them to have this service animal to help with anxiety and depression. I wasn't sure if this had been brought up in past appointments or not.  CB: [phone]

## 2023-02-25 NOTE — Telephone Encounter (Signed)
Letter done on mychart.  

## 2023-02-25 NOTE — Telephone Encounter (Signed)
Called pt he is not able to print letter out. Wanting to pick-up on Monday. Inform will leave at front desk.Marland KitchenJohny Bradford

## 2023-03-01 ENCOUNTER — Ambulatory Visit: Payer: BC Managed Care – PPO | Admitting: Cardiology

## 2023-03-14 ENCOUNTER — Other Ambulatory Visit: Payer: BC Managed Care – PPO

## 2023-03-18 ENCOUNTER — Ambulatory Visit: Payer: BC Managed Care – PPO | Admitting: Cardiology

## 2023-04-13 ENCOUNTER — Other Ambulatory Visit: Payer: BC Managed Care – PPO

## 2023-04-19 ENCOUNTER — Telehealth: Payer: Self-pay | Admitting: Cardiology

## 2023-04-19 NOTE — Telephone Encounter (Signed)
Patient called to cancel upcoming office appointment with you.  He has canceled / No Show on several scheduled visits with office.  He stated he has a lot going on and will call back if needed.

## 2023-04-21 ENCOUNTER — Ambulatory Visit: Payer: BC Managed Care – PPO | Admitting: Cardiology

## 2024-01-19 ENCOUNTER — Telehealth: Payer: Self-pay | Admitting: Internal Medicine

## 2024-01-19 NOTE — Telephone Encounter (Signed)
 FYI

## 2024-01-19 NOTE — Telephone Encounter (Signed)
 Copied from CRM [id number]. Topic: General - Other >> Jan 19, 2024  1:40 PM Corean SAUNDERS wrote: Reason for CRM: Patient is requesting clinic staff to inform Dr. Rollene that patient is in the hospital in [place]  for shortness of breath, dizziness, and chest pain. Patient hospital follow up has been scheduled.

## 2024-01-20 ENCOUNTER — Telehealth: Payer: Self-pay | Admitting: Internal Medicine

## 2024-01-20 NOTE — Telephone Encounter (Signed)
**Note De-identified  [place] Obfuscation** Please advise 

## 2024-01-20 NOTE — Telephone Encounter (Signed)
 Copied from CRM [id number]. Topic: General - Other >> Jan 20, 2024 10:49 AM Deidre DASEN wrote: Reason for CRM: patient is needing a dr note for  today Saturday and Monday he would like the dr note sent through [facility] and his email [email or web address] he would like a call back after this has been done

## 2024-01-20 NOTE — Telephone Encounter (Signed)
 Can do at visit but since he was in the hospital they should do this note for him

## 2024-01-21 ENCOUNTER — Emergency Department (HOSPITAL_COMMUNITY): Payer: BLUE CROSS/BLUE SHIELD

## 2024-01-21 ENCOUNTER — Other Ambulatory Visit: Payer: Self-pay

## 2024-01-21 ENCOUNTER — Inpatient Hospital Stay (HOSPITAL_COMMUNITY): Payer: BLUE CROSS/BLUE SHIELD

## 2024-01-21 ENCOUNTER — Encounter (HOSPITAL_COMMUNITY): Payer: Self-pay

## 2024-01-21 ENCOUNTER — Inpatient Hospital Stay (HOSPITAL_COMMUNITY)
Admission: EM | Admit: 2024-01-21 | Discharge: 2024-01-23 | DRG: 305 | Disposition: A | Discharge status: Home / Self Care | Payer: BLUE CROSS/BLUE SHIELD | Attending: Internal Medicine | Admitting: Internal Medicine

## 2024-01-21 DIAGNOSIS — E876 Hypokalemia: Secondary | ICD-10-CM | POA: Diagnosis present

## 2024-01-21 DIAGNOSIS — Z6841 Body Mass Index (BMI) 40.0 and over, adult: Secondary | ICD-10-CM | POA: Diagnosis not present

## 2024-01-21 DIAGNOSIS — J45909 Unspecified asthma, uncomplicated: Secondary | ICD-10-CM | POA: Diagnosis present

## 2024-01-21 DIAGNOSIS — I161 Hypertensive emergency: Principal | ICD-10-CM

## 2024-01-21 DIAGNOSIS — Z8249 Family history of ischemic heart disease and other diseases of the circulatory system: Secondary | ICD-10-CM | POA: Diagnosis not present

## 2024-01-21 DIAGNOSIS — E785 Hyperlipidemia, unspecified: Secondary | ICD-10-CM | POA: Diagnosis present

## 2024-01-21 DIAGNOSIS — Z91198 Patient's noncompliance with other medical treatment and regimen for other reason: Secondary | ICD-10-CM

## 2024-01-21 DIAGNOSIS — Z79899 Other long term (current) drug therapy: Secondary | ICD-10-CM

## 2024-01-21 DIAGNOSIS — I160 Hypertensive urgency: Principal | ICD-10-CM | POA: Diagnosis present

## 2024-01-21 DIAGNOSIS — R55 Syncope and collapse: Secondary | ICD-10-CM

## 2024-01-21 DIAGNOSIS — F319 Bipolar disorder, unspecified: Secondary | ICD-10-CM | POA: Diagnosis present

## 2024-01-21 DIAGNOSIS — F3177 Bipolar disorder, in partial remission, most recent episode mixed: Secondary | ICD-10-CM

## 2024-01-21 DIAGNOSIS — I10 Essential (primary) hypertension: Secondary | ICD-10-CM | POA: Diagnosis present

## 2024-01-21 DIAGNOSIS — Z888 Allergy status to other drugs, medicaments and biological substances status: Secondary | ICD-10-CM

## 2024-01-21 DIAGNOSIS — E119 Type 2 diabetes mellitus without complications: Secondary | ICD-10-CM | POA: Diagnosis present

## 2024-01-21 DIAGNOSIS — F419 Anxiety disorder, unspecified: Secondary | ICD-10-CM | POA: Diagnosis present

## 2024-01-21 LAB — TROPONIN I (HIGH SENSITIVITY)
Troponin I (High Sensitivity): 33 ng/L — ABNORMAL HIGH (ref <18)
Troponin I (High Sensitivity): 49 ng/L — ABNORMAL HIGH (ref <18)

## 2024-01-21 LAB — CK: Total CK: 165 U/L (ref 49–397)

## 2024-01-21 LAB — BASIC METABOLIC PANEL
Anion gap: 9 (ref 5–15)
BUN: 14 mg/dL (ref 6–20)
CO2: 26 mmol/L (ref 22–32)
Calcium: 8.9 mg/dL (ref 8.9–10.3)
Chloride: 105 mmol/L (ref 98–111)
Creatinine, Ser: 1.41 mg/dL — ABNORMAL HIGH (ref 0.61–1.24)
GFR, Estimated: >60 mL/min (ref >60)
Glucose, Bld: 85 mg/dL (ref 70–99)
Potassium: 3.2 mmol/L — ABNORMAL LOW (ref 3.5–5.1)
Sodium: 140 mmol/L (ref 135–145)

## 2024-01-21 LAB — CBC
HCT: 46.6 % (ref 39.0–52.0)
Hemoglobin: 15.3 g/dL (ref 13.0–17.0)
MCH: 29 pg (ref 26.0–34.0)
MCHC: 32.8 g/dL (ref 30.0–36.0)
MCV: 88.3 fL (ref 80.0–100.0)
Platelets: 319 10*3/uL (ref 150–400)
RBC: 5.28 MIL/uL (ref 4.22–5.81)
RDW: 13.2 % (ref 11.5–15.5)
WBC: 4.3 10*3/uL (ref 4.0–10.5)
nRBC: 0 % (ref 0.0–0.2)

## 2024-01-21 LAB — BRAIN NATRIURETIC PEPTIDE: B Natriuretic Peptide: 67.9 pg/mL (ref 0.0–100.0)

## 2024-01-21 MED ORDER — METOPROLOL TARTRATE 5 MG/5ML IV SOLN
10.0000 mg | INTRAVENOUS | Status: DC | PRN
  Administered 2024-01-22 (×2): 10 mg via INTRAVENOUS
  Filled 2024-01-21 (×2): qty 10

## 2024-01-21 MED ORDER — ACETAMINOPHEN 650 MG RE SUPP: 650.0000 mg | Freq: Four times a day (QID) | RECTAL | Status: DC | PRN

## 2024-01-21 MED ORDER — LABETALOL HCL 5 MG/ML IV SOLN
20.0000 mg | Freq: Once | INTRAVENOUS | Status: AC
  Administered 2024-01-21: 20 mg via INTRAVENOUS
  Filled 2024-01-21: qty 4

## 2024-01-21 MED ORDER — DIPHENHYDRAMINE HCL 50 MG/ML IJ SOLN
12.5000 mg | Freq: Once | INTRAMUSCULAR | Status: AC
  Administered 2024-01-21: 12.5 mg via INTRAVENOUS
  Filled 2024-01-21: qty 1

## 2024-01-21 MED ORDER — IOHEXOL 350 MG/ML SOLN
75.0000 mL | Freq: Once | INTRAVENOUS | Status: AC | PRN
  Administered 2024-01-21: 75 mL via INTRAVENOUS

## 2024-01-21 MED ORDER — SENNOSIDES-DOCUSATE SODIUM 8.6-50 MG PO TABS: 1.0000 | ORAL_TABLET | Freq: Every evening | ORAL | Status: DC | PRN

## 2024-01-21 MED ORDER — KETOROLAC TROMETHAMINE 30 MG/ML IJ SOLN
30.0000 mg | Freq: Once | INTRAMUSCULAR | Status: AC
  Administered 2024-01-21: 30 mg via INTRAVENOUS
  Filled 2024-01-21: qty 1

## 2024-01-21 MED ORDER — ACETAMINOPHEN 325 MG PO TABS
650.0000 mg | ORAL_TABLET | Freq: Four times a day (QID) | ORAL | Status: DC | PRN
  Administered 2024-01-22: 650 mg via ORAL
  Filled 2024-01-21: qty 2

## 2024-01-21 MED ORDER — ONDANSETRON HCL 4 MG PO TABS: 4.0000 mg | ORAL_TABLET | Freq: Four times a day (QID) | ORAL | Status: DC | PRN

## 2024-01-21 MED ORDER — HEPARIN SODIUM (PORCINE) 5000 UNIT/ML IJ SOLN
5000.0000 [IU] | Freq: Three times a day (TID) | INTRAMUSCULAR | Status: DC
  Administered 2024-01-21 – 2024-01-23 (×5): 5000 [IU] via SUBCUTANEOUS
  Filled 2024-01-21 (×5): qty 1

## 2024-01-21 MED ORDER — ONDANSETRON HCL 4 MG/2ML IJ SOLN: 4.0000 mg | Freq: Four times a day (QID) | INTRAMUSCULAR | Status: DC | PRN

## 2024-01-21 MED ORDER — METOCLOPRAMIDE HCL 5 MG/ML IJ SOLN
10.0000 mg | Freq: Once | INTRAMUSCULAR | Status: AC
  Administered 2024-01-21: 10 mg via INTRAVENOUS
  Filled 2024-01-21: qty 2

## 2024-01-21 NOTE — H&P (Signed)
 PCP:   Rollene Almarie LABOR, MD   Chief Complaint:  Syncope and collapse vs seizure  HPI: This is a 33 year old male with past medical history of HTN, bipolar disease, medical noncompliance.  When patient woke this morning his head felt heavy, he sat on the side of the bed, he developed heart palpitation, the room started spinning and he blacked out.  When he woke he called 911.  It occurred a second time prior to EMS arrival, he states he was on the ground shaking.  Per patient he believes he blacked out a total of around 3 times, and on the way to the ER he kept having what he refers to as micro blackouts where he loses awareness for seconds.  He has a history of hypertension, compliant with medication.  His systolic blood pressure baseline is always in the 200s.  He had been referred to cardiology by PCP whom he saw 02/01/2023.  He was set up for evaluation of secondary causes for hypertension.  There was no follow-up.  Patient's presenting blood pressure 223/142, troponin 33=>49.  Potassium 3.2, creatinine 1.4 [baseline normal], CT head normal.  CXR normal.  EKG unchanged from prior.  Patient given labetalol  20 mg IV x 2.  Patient's current SBP 180.  Admission requested for hypertensive emergency During my interview patient complained of severe headache.  Review of Systems:  Per HPI  Past Medical History: Past Medical History:  Diagnosis Date   Anxiety    Asthma    Phreesia 09/23/2020   Bipolar 1 disorder (HCC)    Depression    Depression    [employer]reesia 09/23/2020   Diabetes mellitus without complication (HCC)    [employer]reesia 09/23/2020   Hypertension    [employer]reesia 09/23/2020   Vertigo    Past Surgical History:  Procedure Laterality Date   WISDOM TOOTH EXTRACTION      Medications: Prior to Admission medications   Medication Sig Start Date End Date Taking? Authorizing Provider  albuterol  (VENTOLIN  HFA) 108 (90 Base) MCG/ACT inhaler Inhale 2 puffs into the lungs every 6 (six) hours as  needed for wheezing or shortness of breath. 11/16/22   Rollene Almarie LABOR, MD  ALPRAZolam  (XANAX ) 0.25 MG tablet Take 1 tablet (0.25 mg total) by mouth daily as needed for anxiety. 11/16/22   Rollene Almarie LABOR, MD  amLODipine  (NORVASC ) 10 MG tablet Take 1 tablet (10 mg total) by mouth daily. 11/16/22   Rollene Almarie LABOR, MD  atorvastatin  (LIPITOR) 20 MG tablet Take 1 tablet (20 mg total) by mouth daily. 11/16/22   Rollene Almarie LABOR, MD  indapamide  (LOZOL ) 2.5 MG tablet Take 1 tablet (2.5 mg total) by mouth daily. 11/16/22   Rollene Almarie LABOR, MD  labetalol  (NORMODYNE ) 100 MG tablet Take 1 tablet (100 mg total) by mouth 2 (two) times daily. 02/01/23 03/03/23  Tolia, Sunit, DO  loratadine  (CLARITIN ) 10 MG tablet Take 1 tablet (10 mg total) by mouth daily. 11/16/22   Rollene Almarie LABOR, MD  potassium chloride  SA (KLOR-CON  M) 20 MEQ tablet Take 1 tablet (20 mEq total) by mouth 2 (two) times daily. 02/05/23 03/07/23  Tolia, Sunit, DO  valsartan  (DIOVAN ) 320 MG tablet Take 1 tablet (320 mg total) by mouth daily. 12/14/22   Rollene Almarie LABOR, MD  Vitamin D , Ergocalciferol , (DRISDOL ) 1.25 MG (50000 UNIT) CAPS capsule Take 50,000 Units by mouth once a week. 11/16/22   [provider]    Allergies:   Allergies  Allergen Reactions   Chlorthalidone   Hearing loss   Lisinopril  Cough    Social History:  reports that he has never smoked. He has never used smokeless tobacco. He reports that he does not drink alcohol and does not use drugs.  Family History: Family History  Problem Relation Age of Onset   Hypertension Mother    Stroke Mother    Hypertension Father    Kidney disease Father     Physical Exam: Vitals:   01/21/24 1735 01/21/24 1745 01/21/24 1755 01/21/24 1845  BP: (!) 197/128 (!) 197/120 (!) 195/117 (!) 183/106  Pulse: 84 81 80 79  Resp: 17 18 18 19   Temp:      TempSrc:      SpO2: (!) 88% 97% 96% 97%  Weight:      Height:        General:  A&Ox3, well  developed and nourished, no acute distress Eyes: Pink conjunctiva, no scleral icterus ENT: Moist oral mucosa, neck supple, no thyromegaly Lungs: clear, no wheeze, no crackles, no use of accessory muscles Cardiovascular: RRR, no regurgitation, no bruits, no JVD Abdomen: soft, positive BS, NTND, no organomegaly, not an acute abdomen GU: not examined Neuro: CN II - XII grossly intact, sensation intact Musculoskeletal: strength 5/5 all extremities, no edema Skin: no rash, no subcutaneous crepitation, no decubitus Psych: appropriate patient   Labs on Admission:  Recent Labs    01/21/24 1341  NA 140  K 3.2*  CL 105  CO2 26  GLUCOSE 85  BUN 14  CREATININE 1.41*  CALCIUM  8.9    Recent Labs    01/21/24 1341  WBC 4.3  HGB 15.3  HCT 46.6  MCV 88.3  PLT 319    Radiological Exams on Admission: CT Head Wo Contrast Result Date: 01/21/2024 CLINICAL DATA:  New onset seizure. EXAM: CT HEAD WITHOUT CONTRAST TECHNIQUE: Contiguous axial images were obtained from the base of the skull through the vertex without intravenous contrast. RADIATION DOSE REDUCTION: This exam was performed according to the departmental dose-optimization program which includes automated exposure control, adjustment of the mA and/or kV according to patient size and/or use of iterative reconstruction technique. COMPARISON:  04/28/2007 FINDINGS: Brain: No evidence of intracranial hemorrhage, acute infarction, hydrocephalus, extra-axial collection, or mass lesion/mass effect. Vascular:  No hyperdense vessel or other acute findings. Skull: No evidence of fracture or other significant bone abnormality. Sinuses/Orbits:  No acute findings. Other: None. IMPRESSION: Negative noncontrast head CT. Electronically Signed   By: Norleen DELENA Kil M.D.   On: 01/21/2024 17:40   DG Chest 2 View Result Date: 01/21/2024 CLINICAL DATA:  Chest pain in.  Seizure. EXAM: CHEST - 2 VIEW COMPARISON:  01/04/2023 FINDINGS: Low lung volumes. Cardiopericardial  silhouette is at upper limits of normal for size. Mild vascular congestion without edema. No focal consolidation or pleural effusion. No acute bony abnormality. IMPRESSION: Low volume film with mild vascular congestion. Electronically Signed   By: Camellia Candle M.D.   On: 01/21/2024 14:01    Assessment/Plan Present on Admission:  Hypertensive emergency -MRI brain r/o CVA -Low blood pressure approximately 15% in the first 24 hours.  For now goal  SBP ~ 180. Treat SBP >190, DBP>110 -Resume Norvasc  today.  Resume Diovan  and labetalol  in AM. -Will initiate treatment for secondary causes of blood pressure.  CTA abdomen and pelvis with contrast.  24-hour urine collection for metanephrines, 5-HIAA.  Cortisol level in a.m.  Plasma metanephrines.  Aldosterone plus renin activity with ratio ordered   Syncope and collapse vs seizures -Doubt seizures  but given patient's reports of tremulousness while on floor, will order the EEG for a.m.   HLD -Lipitor resumed   Mild vascular congestion -BNP added -2D echo echo ordered   Bipolar disorder (HCC)  Kiefer Opheim 01/21/2024, 8:05 PM

## 2024-01-21 NOTE — ED Provider Notes (Signed)
 [place] [facility] DEPARTMENT AT [facility] Provider Note   CSN: [id number] Arrival date & time: 01/21/24  1251     History  Chief Complaint  Patient presents with   Seizures    Nathan Bradford is a 33 y.o. male.   Seizures  Patient has a history of bipolar disorder depression diabetes hypertension possible seizures.  Patient states he had a seizure-like episode last week.  He states he was evaluated by a hospital in [place]  was not prescribed any medications.  Patient states he had another episode today.  He thinks he may have had a seizure.  Patient states he was at home.  He started to feel lightheaded.  He thinks he may have passed out and had a seizure.  Patient does not report any post ictal state.  He called 911 was brought to the ED for evaluation.  Patient states he has poorly controlled blood pressure.  Patient states his blood pressure has been like this for the past year.  He states he has taken his medications.  Patient states they have not adjust his medication regimen.  I reviewed outpatient records and patient was last seen by his primary care doctor in February 2024.  He was referred to cardiology for further evaluation of his blood pressure.  Patient did see his cardiologist back in February of last year.  Notes indicate he was supposed to have some additional testing.  Appears patient did not follow-up in the office since that time.  There is a note back in May 2024 indicating that patient was canceling his follow-up appointments as he had a lot of personal issues at that time.  Patient reports he thinks he may have been temporarily homeless at that time.    Home Medications Prior to Admission medications   Medication Sig Start Date End Date Taking? Authorizing Provider  albuterol  (VENTOLIN  HFA) 108 (90 Base) MCG/ACT inhaler Inhale 2 puffs into the lungs every 6 (six) hours as needed for wheezing or shortness of breath. 11/16/22   Rollene Almarie LABOR, MD   ALPRAZolam  (XANAX ) 0.25 MG tablet Take 1 tablet (0.25 mg total) by mouth daily as needed for anxiety. 11/16/22   Rollene Almarie LABOR, MD  amLODipine  (NORVASC ) 10 MG tablet Take 1 tablet (10 mg total) by mouth daily. 11/16/22   Rollene Almarie LABOR, MD  atorvastatin  (LIPITOR) 20 MG tablet Take 1 tablet (20 mg total) by mouth daily. 11/16/22   Rollene Almarie LABOR, MD  indapamide  (LOZOL ) 2.5 MG tablet Take 1 tablet (2.5 mg total) by mouth daily. 11/16/22   Rollene Almarie LABOR, MD  labetalol  (NORMODYNE ) 100 MG tablet Take 1 tablet (100 mg total) by mouth 2 (two) times daily. 02/01/23 03/03/23  Tolia, Sunit, DO  loratadine  (CLARITIN ) 10 MG tablet Take 1 tablet (10 mg total) by mouth daily. 11/16/22   Rollene Almarie LABOR, MD  potassium chloride  SA (KLOR-CON  M) 20 MEQ tablet Take 1 tablet (20 mEq total) by mouth 2 (two) times daily. 02/05/23 03/07/23  Tolia, Sunit, DO  valsartan  (DIOVAN ) 320 MG tablet Take 1 tablet (320 mg total) by mouth daily. 12/14/22   Rollene Almarie LABOR, MD  Vitamin D , Ergocalciferol , (DRISDOL ) 1.25 MG (50000 UNIT) CAPS capsule Take 50,000 Units by mouth once a week. 11/16/22   [provider]      Allergies    Chlorthalidone  and Lisinopril     Review of Systems   Review of Systems  Neurological:  Positive for seizures.    Physical Exam  Updated Vital Signs BP (!) 183/106   Pulse 79   Temp 98.4 F (36.9 C) (Oral)   Resp 19   Ht 1.829 m (6')   Wt 133.8 kg   SpO2 97%   BMI 40.01 kg/m  Physical Exam Vitals and nursing note reviewed.  Constitutional:      General: He is not in acute distress.    Appearance: He is well-developed.  HENT:     Head: Normocephalic and atraumatic.     Right Ear: External ear normal.     Left Ear: External ear normal.  Eyes:     General: No scleral icterus.       Right eye: No discharge.        Left eye: No discharge.     Conjunctiva/sclera: Conjunctivae normal.  Neck:     Trachea: No tracheal deviation.  Cardiovascular:      Rate and Rhythm: Normal rate and regular rhythm.  Pulmonary:     Effort: Pulmonary effort is normal. No respiratory distress.     Breath sounds: Normal breath sounds. No stridor. No wheezing or rales.  Abdominal:     General: Bowel sounds are normal. There is no distension.     Palpations: Abdomen is soft.     Tenderness: There is no abdominal tenderness. There is no guarding or rebound.  Musculoskeletal:        General: No tenderness or deformity.     Cervical back: Neck supple.  Skin:    General: Skin is warm and dry.     Findings: No rash.  Neurological:     General: No focal deficit present.     Mental Status: He is alert.     Cranial Nerves: No cranial nerve deficit, dysarthria or facial asymmetry.     Sensory: No sensory deficit.     Motor: No weakness, abnormal muscle tone or seizure activity.     Coordination: Coordination normal.     Comments: Normal strength and sensation of bilateral upper extremities lower extremities  Psychiatric:        Mood and Affect: Mood normal.     ED Results / Procedures / Treatments   Labs (all labs ordered are listed, but only abnormal results are displayed) Labs Reviewed  BASIC METABOLIC PANEL - Abnormal; Notable for the following components:      Result Value   Potassium 3.2 (*)    Creatinine, Ser 1.41 (*)    All other components within normal limits  TROPONIN I (HIGH SENSITIVITY) - Abnormal; Notable for the following components:   Troponin I (High Sensitivity) 33 (*)    All other components within normal limits  TROPONIN I (HIGH SENSITIVITY) - Abnormal; Notable for the following components:   Troponin I (High Sensitivity) 49 (*)    All other components within normal limits  CBC    EKG EKG Interpretation Date/Time:  Saturday January 21 2024 16:31:09 EST Ventricular Rate:  84 PR Interval:  197 QRS Duration:  101 QT Interval:  380 QTC Calculation: 450 R Axis:   -4  Text Interpretation: Sinus rhythm Probable left atrial  enlargement RSR' in V1 or V2, right VCD or RVH Abnormal T, consider ischemia, diffuse leads ST elevation, consider anterior injury nsclt earlier today Confirmed by Randol Simmonds [phone]) on 01/21/2024 4:59:29 PM  Radiology CT Head Wo Contrast Result Date: 01/21/2024 CLINICAL DATA:  New onset seizure. EXAM: CT HEAD WITHOUT CONTRAST TECHNIQUE: Contiguous axial images were obtained from the base of the skull through the  vertex without intravenous contrast. RADIATION DOSE REDUCTION: This exam was performed according to the departmental dose-optimization program which includes automated exposure control, adjustment of the mA and/or kV according to patient size and/or use of iterative reconstruction technique. COMPARISON:  04/28/2007 FINDINGS: Brain: No evidence of intracranial hemorrhage, acute infarction, hydrocephalus, extra-axial collection, or mass lesion/mass effect. Vascular:  No hyperdense vessel or other acute findings. Skull: No evidence of fracture or other significant bone abnormality. Sinuses/Orbits:  No acute findings. Other: None. IMPRESSION: Negative noncontrast head CT. Electronically Signed   By: Norleen DELENA Kil M.D.   On: 01/21/2024 17:40   DG Chest 2 View Result Date: 01/21/2024 CLINICAL DATA:  Chest pain in.  Seizure. EXAM: CHEST - 2 VIEW COMPARISON:  01/04/2023 FINDINGS: Low lung volumes. Cardiopericardial silhouette is at upper limits of normal for size. Mild vascular congestion without edema. No focal consolidation or pleural effusion. No acute bony abnormality. IMPRESSION: Low volume film with mild vascular congestion. Electronically Signed   By: Camellia Candle M.D.   On: 01/21/2024 14:01    Procedures .Critical Care  Performed by: Randol Simmonds, MD Authorized by: Randol Simmonds, MD   Critical care provider statement:    Critical care time (minutes):  30   Critical care was time spent personally by me on the following activities:  Development of treatment plan with patient or surrogate, discussions  with consultants, evaluation of patient's response to treatment, examination of patient, ordering and review of laboratory studies, ordering and review of radiographic studies, ordering and performing treatments and interventions, pulse oximetry, re-evaluation of patient's condition and review of old charts     Medications Ordered in ED Medications  labetalol  (NORMODYNE ) injection 20 mg (20 mg Intravenous Given 01/21/24 1730)  labetalol  (NORMODYNE ) injection 20 mg (20 mg Intravenous Given 01/21/24 1838)    ED Course/ Medical Decision Making/ A&P Clinical Course as of 01/21/24 1948  Sat Jan 21, 2024  1659 Blood pressure noted to be severely elevated.  Will [JK]  1751 Troponin I (High Sensitivity)(!) Troponin elevated, similar to previous [JK]  1751 Metabolic panel similar to previous [JK]  1751 CBC normal [JK]  1751 Head CT without acute findings [JK]  1752 Chest x-ray suggest mild vascular congestion [JK]  1808 Blood pressure improving 195/117 [JK]  1948 Case discussed with Dr Floy regarding admission [JK]    Clinical Course User Index [JK] Randol Simmonds, MD                                 Medical Decision Making Problems Addressed: Hypertensive emergency: acute illness or injury that poses a threat to life or bodily functions Syncope, unspecified syncope type: acute illness or injury that poses a threat to life or bodily functions  Amount and/or Complexity of Data Reviewed Labs: ordered. Decision-making details documented in ED Course. Radiology: ordered and independent interpretation performed.  Risk Prescription drug management. Decision regarding hospitalization.   Patient presented to the ED for evaluation of possible syncope versus seizures.  Patient states he thinks he had a seizure but there is no reported shaking episode tonic-clonic activity.  Patient also noted to be severely hypertensive here in the ED.  Patient states he has been taking his medications but his  blood pressure has been poorly controlled for a year.  Record review indicates patient has not seen his primary care doctor or cardiologist for about a year.  Patient started on IV antihypertensive agents.  Some  improvement but will need to Continue to address his blood pressure.  With his elevated blood pressure possible syncope versus seizure I think he would benefit from hospitalization and further treatment.  May end up needing EEG.  I will consult medical service        Final Clinical Impression(s) / ED Diagnoses Final diagnoses:  Hypertensive emergency  Syncope, unspecified syncope type    Rx / DC Orders ED Discharge Orders     None         Randol Simmonds, MD 01/21/24 1948

## 2024-01-21 NOTE — ED Notes (Signed)
 Patient given sandwich and water. Patient has no complaints at this time.

## 2024-01-21 NOTE — ED Triage Notes (Signed)
 BIB EMS from home for seizures. Pt had his first seizure last week and went to a hospital in [place] , pt was not prescribed any medication for seizures. Pt states he had a seizure today and he remembers all details or seizure, no post ictal state. A&Ox4.

## 2024-01-22 ENCOUNTER — Inpatient Hospital Stay (HOSPITAL_COMMUNITY): Payer: BLUE CROSS/BLUE SHIELD

## 2024-01-22 DIAGNOSIS — R55 Syncope and collapse: Secondary | ICD-10-CM | POA: Diagnosis not present

## 2024-01-22 DIAGNOSIS — I161 Hypertensive emergency: Secondary | ICD-10-CM | POA: Diagnosis not present

## 2024-01-22 LAB — CBC WITH DIFFERENTIAL/PLATELET
Abs Immature Granulocytes: 0.01 10*3/uL (ref 0.00–0.07)
Basophils Absolute: 0 10*3/uL (ref 0.0–0.1)
Basophils Relative: 1 %
Eosinophils Absolute: 0 10*3/uL (ref 0.0–0.5)
Eosinophils Relative: 1 %
HCT: 44.7 % (ref 39.0–52.0)
Hemoglobin: 14.4 g/dL (ref 13.0–17.0)
Immature Granulocytes: 0 %
Lymphocytes Relative: 28 %
Lymphs Abs: 1.2 10*3/uL (ref 0.7–4.0)
MCH: 28.3 pg (ref 26.0–34.0)
MCHC: 32.2 g/dL (ref 30.0–36.0)
MCV: 88 fL (ref 80.0–100.0)
Monocytes Absolute: 0.3 10*3/uL (ref 0.1–1.0)
Monocytes Relative: 7 %
Neutro Abs: 2.7 10*3/uL (ref 1.7–7.7)
Neutrophils Relative %: 63 %
Platelets: 297 10*3/uL (ref 150–400)
RBC: 5.08 MIL/uL (ref 4.22–5.81)
RDW: 13.3 % (ref 11.5–15.5)
WBC: 4.3 10*3/uL (ref 4.0–10.5)
nRBC: 0 % (ref 0.0–0.2)

## 2024-01-22 LAB — RAPID URINE DRUG SCREEN, HOSP PERFORMED
Amphetamines: NOT DETECTED
Barbiturates: NOT DETECTED
Benzodiazepines: NOT DETECTED
Cocaine: NOT DETECTED
Opiates: NOT DETECTED
Tetrahydrocannabinol: NOT DETECTED

## 2024-01-22 LAB — BASIC METABOLIC PANEL
Anion gap: 8 (ref 5–15)
BUN: 14 mg/dL (ref 6–20)
CO2: 28 mmol/L (ref 22–32)
Calcium: 8.7 mg/dL — ABNORMAL LOW (ref 8.9–10.3)
Chloride: 102 mmol/L (ref 98–111)
Creatinine, Ser: 1.17 mg/dL (ref 0.61–1.24)
GFR, Estimated: >60 mL/min (ref >60)
Glucose, Bld: 93 mg/dL (ref 70–99)
Potassium: 3.1 mmol/L — ABNORMAL LOW (ref 3.5–5.1)
Sodium: 138 mmol/L (ref 135–145)

## 2024-01-22 LAB — ECHOCARDIOGRAM COMPLETE
AR max vel: 2.53 cm2
AV Area VTI: 2.23 cm2
AV Area mean vel: 2.42 cm2
AV Mean grad: 5 mm[Hg]
AV Peak grad: 8.8 mm[Hg]
Ao pk vel: 1.48 m/s
Area-P 1/2: 2.54 cm2
Est EF: 55
Height: 72 in
S' Lateral: 4.4 cm
Weight: 4720 [oz_av]

## 2024-01-22 LAB — MAGNESIUM: Magnesium: 2.1 mg/dL (ref 1.7–2.4)

## 2024-01-22 LAB — CORTISOL-AM, BLOOD: Cortisol - AM: 7.6 ug/dL (ref 6.7–22.6)

## 2024-01-22 MED ORDER — POTASSIUM CHLORIDE CRYS ER 20 MEQ PO TBCR
40.0000 meq | EXTENDED_RELEASE_TABLET | Freq: Once | ORAL | Status: AC
Start: 2024-01-22 — End: 2024-01-22
  Administered 2024-01-22: 40 meq via ORAL
  Filled 2024-01-22: qty 2

## 2024-01-22 MED ORDER — LABETALOL HCL 100 MG PO TABS
100.0000 mg | ORAL_TABLET | Freq: Three times a day (TID) | ORAL | Status: DC
  Administered 2024-01-22 (×2): 100 mg via ORAL
  Filled 2024-01-22 (×3): qty 1

## 2024-01-22 MED ORDER — INDAPAMIDE 1.25 MG PO TABS
2.5000 mg | ORAL_TABLET | Freq: Every day | ORAL | Status: DC
  Administered 2024-01-22 – 2024-01-23 (×2): 2.5 mg via ORAL
  Filled 2024-01-22 (×3): qty 2

## 2024-01-22 MED ORDER — AMLODIPINE BESYLATE 10 MG PO TABS
10.0000 mg | ORAL_TABLET | Freq: Every day | ORAL | Status: DC
Start: 2024-01-22 — End: 2024-01-23
  Administered 2024-01-22 – 2024-01-23 (×2): 10 mg via ORAL
  Filled 2024-01-22: qty 1
  Filled 2024-01-22: qty 2

## 2024-01-22 MED ORDER — LABETALOL HCL 100 MG PO TABS
100.0000 mg | ORAL_TABLET | Freq: Two times a day (BID) | ORAL | Status: DC
  Administered 2024-01-22: 100 mg via ORAL
  Filled 2024-01-22: qty 1

## 2024-01-22 MED ORDER — IRBESARTAN 150 MG PO TABS
300.0000 mg | ORAL_TABLET | Freq: Every day | ORAL | Status: DC
  Administered 2024-01-22 – 2024-01-23 (×2): 300 mg via ORAL
  Filled 2024-01-22: qty 2
  Filled 2024-01-22: qty 1

## 2024-01-22 MED ORDER — POTASSIUM CHLORIDE CRYS ER 20 MEQ PO TBCR
40.0000 meq | EXTENDED_RELEASE_TABLET | Freq: Once | ORAL | Status: AC
  Administered 2024-01-22: 40 meq via ORAL
  Filled 2024-01-22: qty 2

## 2024-01-22 MED ORDER — LABETALOL HCL 5 MG/ML IV SOLN: 10.0000 mg | INTRAVENOUS | Status: DC | PRN

## 2024-01-22 NOTE — ED Notes (Signed)
 Started 24 hour urine collection for 5HIAA and Metanephrines.

## 2024-01-22 NOTE — ED Notes (Signed)
 Patient resting at this time. Patient has no complaints or needs expressed at this moment. Patient went back to sleep.

## 2024-01-22 NOTE — Progress Notes (Signed)
 PROGRESS NOTE  Nathan Bradford  FMW:[id number] DOB: [birth year] DOA: 01/21/2024 PCP: Rollene Almarie LABOR, MD   Brief Narrative: Patient is a 33 year old male with history of hypertension, bipolar disorder, noncompliance who presented with multiple complaints.  He was having palpitations, dizziness.  When EMS arrived, his blood pressure was in the range of 200s systolic.  Patient was evaluated for severe hypertension in the past but as per the report he was noncompliant.  On presentation, blood pressure systolic was 223.  He complained of headache lab work showed potassium of 3.2, creatinine of 1.4.  CT head/MRI doesnot show any acute findings.  Chest x-ray did not show any acute findings.  Started on antihypertensives.  Being managed for severe hypertension  Assessment & Plan:  Principal Problem:   Hypertensive emergency Active Problems:   Accelerated hypertension   Bipolar disorder (HCC)   Syncope and collapse  Hypertensive urgency: Presented with severe hypertension.  History of hypertension with noncompliance but patient said that he was taking his medications.  He does not remember the name of the medications.  He was supposed to take amlodipine , Diovan , labetalol ,indapamide .. Patient was investigated for hypertension at this young age and was referred to cardiology.  But as per report, he did not follow-up.CTA abdomen/pelvis did not show any evidence of renal artery stenosis or fibromuscular dysplasia.Pending 24-hour urine collection for metanephrine, 5-HIAA, plasma cortisol, metanephrines. aldosterone/renin activity.  Continue monitoring blood pressure, continue current medication.  Continue as needed medications for severe hypertension. Echo has been ordered as well. UDS negative  Syncope/collapse versus seizure: This is likely from severe hypertension.  Echo pending.  No history of seizures  Hyperlipidemia: On Lipitor  Hypokalemia: Supplemented with potassium and corrected  Morbid  obesity: BMI 40         DVT prophylaxis:heparin  injection 5,000 Units Start: 01/21/24 2200     Code Status: Full Code  Family Communication: None at bedside  Patient status:Inpatient  Patient is from :Home  Anticipated discharge un:ynfz  Estimated DC date:after stable blood pressure   Consultants: None  Procedures: None  Antimicrobials:  Anti-infectives (From admission, onward)    None       Subjective: Patient seen and examined at bedside today.  During my evaluation, his systolic blood pressure was in the range of 160s.  He denies any headache, nausea or vomiting.  Objective: Vitals:   01/22/24 0430 01/22/24 0515 01/22/24 0730 01/22/24 0745  BP: (!) 164/103 (!) 174/105 (!) 168/124 (!) 166/103  Pulse: 77 76 76 71  Resp:  17 17 18   Temp:  98.6 F (37 C)    TempSrc:      SpO2: 95% 100% 96% 96%  Weight:      Height:       No intake or output data in the 24 hours ending 01/22/24 0811 Filed Weights   01/21/24 1300  Weight: 133.8 kg    Examination:  General exam: Overall comfortable, not in distress, morbidly obese HEENT: PERRL Respiratory system:  no wheezes or crackles  Cardiovascular system: S1 & S2 heard, RRR.  Gastrointestinal system: Abdomen is nondistended, soft and nontender. Central nervous system: Alert and oriented Extremities: No edema, no clubbing ,no cyanosis Skin: No rashes, no ulcers,no icterus     Data Reviewed: I have personally reviewed following labs and imaging studies  CBC: Recent Labs  Lab 01/21/24 1341 01/22/24 0726  WBC 4.3 4.3  NEUTROABS  --  2.7  HGB 15.3 14.4  HCT 46.6 44.7  MCV 88.3 88.0  PLT 319 297   Basic Metabolic Panel: Recent Labs  Lab 01/21/24 1341 01/22/24 0726  NA 140 138  K 3.2* 3.1*  CL 105 102  CO2 26 28  GLUCOSE 85 93  BUN 14 14  CREATININE 1.41* 1.17  CALCIUM  8.9 8.7*  MG  --  2.1     No results found for this or any previous visit (from the past 240 hours).   Radiology  Studies: MR BRAIN WO CONTRAST Result Date: 01/22/2024 CLINICAL DATA:  Initial evaluation for acute seizure. EXAM: MRI HEAD WITHOUT CONTRAST TECHNIQUE: Multiplanar, multiecho pulse sequences of the brain and surrounding structures were obtained without intravenous contrast. COMPARISON:  CT from earlier the same day. FINDINGS: Brain: Cerebral volume within normal limits. Scattered patchy T2/FLAIR hyperintensity involving the periventricular and deep white matter both cerebral hemispheres, most likely related chronic microvascular ischemic disease, mild in nature, but advanced for age. No abnormal foci of restricted diffusion to suggest acute or subacute ischemia. Gray-white matter digestion maintained. Nor is of chronic cortical infarction. No acute or chronic intracranial blood products. No mass lesion, midline shift or mass effect. No hydrocephalus or extra-axial fluid collection. Pituitary gland and suprasellar region within normal limits. Vascular: Major intracranial vascular flow voids are maintained. Skull and upper cervical spine: Craniocervical junction within normal limits. Decreased T1 signal intensity noted within the visualized bone marrow, nonspecific, but most commonly related to anemia, smoking or obesity. No scalp soft tissue abnormality. Sinuses/Orbits: Globes orbital soft tissues within normal limits. Left maxillary sinus retention cyst noted. No significant mastoid effusion. Other: None. IMPRESSION: 1. No acute intracranial abnormality. 2. Mild cerebral white matter disease, nonspecific, but most likely related chronic microvascular ischemic disease. Although changes are mild in nature, appearance is advanced for age. Electronically Signed   By: Morene Hoard M.D.   On: 01/22/2024 00:57   CT ANGIO ABDOMEN W &/OR WO CONTRAST Result Date: 01/22/2024 CLINICAL DATA:  Uncontrolled hypertension. Evaluate for renal artery stenosis. EXAM: CT ANGIOGRAPHY ABDOMEN TECHNIQUE: Multidetector CT imaging  of the abdomen was performed using the standard protocol during bolus administration of intravenous contrast. Multiplanar reconstructed images and MIPs were obtained and reviewed to evaluate the vascular anatomy. RADIATION DOSE REDUCTION: This exam was performed according to the departmental dose-optimization program which includes automated exposure control, adjustment of the mA and/or kV according to patient size and/or use of iterative reconstruction technique. CONTRAST:  75mL OMNIPAQUE  IOHEXOL  350 MG/ML SOLN COMPARISON:  None Available. FINDINGS: VASCULAR Aorta: Normal. Celiac: Patent without evidence of aneurysm, dissection, vasculitis or significant stenosis. There is variant anatomy with a hepato duodenal common trunk arising just to the right of the celiac trunk. There is no branch occlusion. Left gastric artery arises normally from the celiac artery. SMA: Normal. No branch occlusion as far seen. The entire pelvis was not scanned is only an abdominal CT was requested. Renals: Both renal arteries are patent without evidence of aneurysm, dissection, vasculitis, fibromuscular dysplasia or significant stenosis. IMA: Normal. Inflow: There are small amounts of scattered calcific plaque in the common iliac and bilateral internal iliac arteries, but this is unusual in a 33 year old warranting clinical correlation. The visualized inflow arteries are widely patent with imaging stopped at about the proximal 1/3 of the internal and external iliac arteries. Veins: No obvious venous abnormality within the limitations of this arterial phase study. There is a normal variant circumaortic left renal vein. Review of the MIP images confirms the above findings. NON-VASCULAR Lower chest: There are posterior atelectatic changes in  the lung bases without infiltrates. Mild elevation right hemidiaphragm. Mild cardiomegaly with a left chamber predominance and calcifications in the right coronary artery, both findings also unusual in a  33 year old. There are mild thickened bronchi in both lower lobes. Hepatobiliary: The liver is 18 cm length and mildly steatotic. There are no mass enhancement. Gallbladder and bile ducts are unremarkable. Pancreas: No abnormality. Spleen: No abnormality. Adrenals/Urinary Tract: Adrenal glands are unremarkable. Kidneys are normal, without renal calculi, focal lesion, or hydronephrosis. Bladder is not imaged. Stomach/Bowel: No dilatation or wall thickening including the retrocecal appendix. Lymphatic: No enlarged lymph nodes are seen in the abdomen. Other: Small umbilical fat hernia. No incarcerated hernia. No free hemorrhage, free air, or free fluid. No focal inflammatory process or localizing collections. Musculoskeletal: No acute or significant osseous findings. IMPRESSION: 1. No evidence of renal artery stenosis or fibromuscular dysplasia. 2. Small amounts of scattered calcific plaque in the common iliac and bilateral internal iliac arteries, unusual in a 33 year old warranting clinical correlation. 3. Mild cardiomegaly with right coronary artery calcifications, also unusual in a 33 year old. 4. Mildly steatotic liver. 5. No acute abdominal findings. 6. Small umbilical fat hernia. 7. Mild thickened bronchi in both lower lobes. Electronically Signed   By: Francis Quam M.D.   On: 01/22/2024 00:52   CT Head Wo Contrast Result Date: 01/21/2024 CLINICAL DATA:  New onset seizure. EXAM: CT HEAD WITHOUT CONTRAST TECHNIQUE: Contiguous axial images were obtained from the base of the skull through the vertex without intravenous contrast. RADIATION DOSE REDUCTION: This exam was performed according to the departmental dose-optimization program which includes automated exposure control, adjustment of the mA and/or kV according to patient size and/or use of iterative reconstruction technique. COMPARISON:  04/28/2007 FINDINGS: Brain: No evidence of intracranial hemorrhage, acute infarction, hydrocephalus, extra-axial  collection, or mass lesion/mass effect. Vascular:  No hyperdense vessel or other acute findings. Skull: No evidence of fracture or other significant bone abnormality. Sinuses/Orbits:  No acute findings. Other: None. IMPRESSION: Negative noncontrast head CT. Electronically Signed   By: Norleen DELENA Kil M.D.   On: 01/21/2024 17:40   DG Chest 2 View Result Date: 01/21/2024 CLINICAL DATA:  Chest pain in.  Seizure. EXAM: CHEST - 2 VIEW COMPARISON:  01/04/2023 FINDINGS: Low lung volumes. Cardiopericardial silhouette is at upper limits of normal for size. Mild vascular congestion without edema. No focal consolidation or pleural effusion. No acute bony abnormality. IMPRESSION: Low volume film with mild vascular congestion. Electronically Signed   By: Camellia Candle M.D.   On: 01/21/2024 14:01    Scheduled Meds:  heparin   5,000 Units Subcutaneous Q8H   potassium chloride   40 mEq Oral Once   Continuous Infusions:   LOS: 1 day   Ivonne Mustache, MD [facility] P2/08/2024, 8:11 AM

## 2024-01-22 NOTE — Plan of Care (Signed)
  Problem: Pain Managment: Goal: General experience of comfort will improve and/or be controlled Outcome: Progressing   Problem: Safety: Goal: Ability to remain free from injury will improve Outcome: Progressing   Problem: Clinical Measurements: Goal: Will remain free from infection Outcome: Progressing

## 2024-01-22 NOTE — ED Notes (Signed)
Patient resting at this time. No needs expressed.

## 2024-01-23 ENCOUNTER — Other Ambulatory Visit: Payer: Self-pay

## 2024-01-23 ENCOUNTER — Other Ambulatory Visit (HOSPITAL_COMMUNITY): Payer: Self-pay

## 2024-01-23 ENCOUNTER — Inpatient Hospital Stay: Payer: BC Managed Care – PPO | Admitting: Internal Medicine

## 2024-01-23 DIAGNOSIS — I161 Hypertensive emergency: Secondary | ICD-10-CM | POA: Diagnosis not present

## 2024-01-23 LAB — BASIC METABOLIC PANEL
Anion gap: 7 (ref 5–15)
BUN: 14 mg/dL (ref 6–20)
CO2: 28 mmol/L (ref 22–32)
Calcium: 8.8 mg/dL — ABNORMAL LOW (ref 8.9–10.3)
Chloride: 101 mmol/L (ref 98–111)
Creatinine, Ser: 1.17 mg/dL (ref 0.61–1.24)
GFR, Estimated: >60 mL/min (ref >60)
Glucose, Bld: 97 mg/dL (ref 70–99)
Potassium: 3.1 mmol/L — ABNORMAL LOW (ref 3.5–5.1)
Sodium: 136 mmol/L (ref 135–145)

## 2024-01-23 MED ORDER — AMLODIPINE BESYLATE 10 MG PO TABS
10.0000 mg | ORAL_TABLET | Freq: Every day | ORAL | 0 refills | Status: DC
  Filled 2024-01-23: qty 60, 60d supply, fill #0

## 2024-01-23 MED ORDER — LABETALOL HCL 200 MG PO TABS
200.0000 mg | ORAL_TABLET | Freq: Three times a day (TID) | ORAL | Status: DC
  Administered 2024-01-23: 200 mg via ORAL
  Filled 2024-01-23 (×2): qty 1

## 2024-01-23 MED ORDER — VALSARTAN 320 MG PO TABS
320.0000 mg | ORAL_TABLET | Freq: Every day | ORAL | 0 refills | Status: DC
  Filled 2024-01-23: qty 60, 60d supply, fill #0

## 2024-01-23 MED ORDER — INDAPAMIDE 2.5 MG PO TABS: 2.5000 mg | ORAL_TABLET | Freq: Every day | ORAL | 0 refills | Status: DC

## 2024-01-23 MED ORDER — INDAPAMIDE 2.5 MG PO TABS
2.5000 mg | ORAL_TABLET | Freq: Every day | ORAL | 0 refills | Status: DC
  Filled 2024-01-23: qty 60, 60d supply, fill #0

## 2024-01-23 MED ORDER — POTASSIUM CHLORIDE CRYS ER 20 MEQ PO TBCR: 60.0000 meq | EXTENDED_RELEASE_TABLET | Freq: Once | ORAL | Status: DC

## 2024-01-23 MED ORDER — POTASSIUM CHLORIDE CRYS ER 20 MEQ PO TBCR
20.0000 meq | EXTENDED_RELEASE_TABLET | Freq: Two times a day (BID) | ORAL | 0 refills | Status: AC
Start: 2024-01-23 — End: 2024-01-30
  Filled 2024-01-23: qty 14, 7d supply, fill #0

## 2024-01-23 MED ORDER — POTASSIUM CHLORIDE CRYS ER 20 MEQ PO TBCR
40.0000 meq | EXTENDED_RELEASE_TABLET | Freq: Once | ORAL | Status: AC
  Administered 2024-01-23: 40 meq via ORAL
  Filled 2024-01-23: qty 2

## 2024-01-23 MED ORDER — LABETALOL HCL 200 MG PO TABS
200.0000 mg | ORAL_TABLET | Freq: Three times a day (TID) | ORAL | 0 refills | Status: DC
  Filled 2024-01-23: qty 88, 28d supply, fill #0

## 2024-01-23 NOTE — Progress Notes (Signed)
   01/23/24 1046  TOC Brief Assessment  Insurance and Status Reviewed  Patient has primary care physician Yes  Home environment has been reviewed apartment  Prior level of function: independent  Prior/Current Home Services No current home services  Social Drivers of Health Review SDOH reviewed no interventions necessary  Readmission risk has been reviewed Yes  Transition of care needs no transition of care needs at this time

## 2024-01-23 NOTE — Plan of Care (Signed)

## 2024-01-23 NOTE — Discharge Summary (Signed)
 Physician Discharge Summary  Prabhjot Beger NWG:[id number] DOB: [birth year] DOA: 01/21/2024  PCP: Adelia Homestead, MD  Admit date: 01/21/2024 Discharge date: 01/23/2024  Admitted From: Home Disposition:  Home  Discharge Condition:Stable CODE STATUS:FULL Diet recommendation: Heart Healthy   Brief/Interim Summary:  Nathan Bradford is a 33 year old male with history of hypertension, bipolar disorder, noncompliance who presented with multiple complaints.  He was having palpitations, dizziness.  When EMS arrived, his blood pressure was in the range of 200s systolic.  Nathan Bradford was evaluated for severe hypertension in the past but as per the report he was noncompliant.  On presentation, blood pressure systolic was 223.  He complained of headache lab work showed potassium of 3.2, creatinine of 1.4.  CT head/MRI doesnot show any acute findings.  Chest x-ray did not show any acute findings.  Started on antihypertensives.  Blood pressure improved.  Extensively discussed about the need of compliance and medication and follow-up with PCP.  Currently being discharged on current medications.  Hemodynamically stable for discharge  Following problems were addressed during the hospitalization:  Hypertensive urgency: Presented with severe hypertension.  History of hypertension with noncompliance but Nathan Bradford said that he was taking his medications.  He does not remember the name of the medications.  He was supposed to take amlodipine , Diovan , labetalol ,indapamide .. Nathan Bradford was investigated for hypertension at this young age and was referred to cardiology.  But as per report, he did not follow-up.CTA abdomen/pelvis did not show any evidence of renal artery stenosis or fibromuscular dysplasia.Pending 24-hour urine metanephrine, 5-HIAA, plasma metanephrines, aldosterone/renin activity.  Cortisol level normal.  Blood pressure is better.  Continue current blood pressure medications on discharge.  I have sent message to her PCP to  arrange follow-up in a week.  He needs to monitor his blood pressure at home. Echo showed normal EF, severe left ventricular hypertrophy.  UDS negative   Syncope/collapse versus seizure: This is likely from severe hypertension.  Echo as above.  No history of seizures.  Low suspicion for seizure activity.   Hyperlipidemia:Was on lipitor in the past   Hypokalemia: Looks like he chronic hypokalemia.  Continue potassium supplementation on discharge.  Needs to check BMP in a week.   Morbid obesity: BMI 40  Discharge Diagnoses:  Principal Problem:   Hypertensive emergency Active Problems:   Accelerated hypertension   Bipolar disorder (HCC)   Syncope and collapse    Discharge Instructions  Discharge Instructions     Diet - low sodium heart healthy   Complete by: As directed    Discharge instructions   Complete by: As directed    1)Please take prescribed  medications as instructed 2)Follow up with your PCP in a week 3)Monitor your blood pressure  at home.   Increase activity slowly   Complete by: As directed       Allergies as of 01/23/2024       Reactions   Chlorthalidone  Other (See Comments)   Hearing loss   Lisinopril  Cough        Medication List     STOP taking these medications    potassium chloride  SA 20 MEQ tablet Commonly known as: KLOR-CON  M       TAKE these medications    Acetaminophen  500 MG capsule Take 500-1,000 mg by mouth every 6 (six) hours as needed for pain or fever.   albuterol  108 (90 Base) MCG/ACT inhaler Commonly known as: VENTOLIN  HFA Inhale 2 puffs into the lungs every 6 (six) hours as needed for wheezing or shortness  of breath.   ALPRAZolam  0.25 MG tablet Commonly known as: XANAX  Take 1 tablet (0.25 mg total) by mouth daily as needed for anxiety.   amLODipine  10 MG tablet Commonly known as: NORVASC  Take 1 tablet (10 mg total) by mouth daily. What changed:  when to take this reasons to take this   atorvastatin  20 MG  tablet Commonly known as: LIPITOR Take 1 tablet (20 mg total) by mouth daily.   indapamide  2.5 MG tablet Commonly known as: LOZOL  Take 1 tablet (2.5 mg total) by mouth daily.   labetalol  200 MG tablet Commonly known as: NORMODYNE  Take 1 tablet (200 mg total) by mouth 3 (three) times daily. What changed:  medication strength how much to take when to take this   loratadine  10 MG tablet Commonly known as: CLARITIN  Take 1 tablet (10 mg total) by mouth daily. What changed:  when to take this reasons to take this   valsartan  320 MG tablet Commonly known as: DIOVAN  Take 1 tablet (320 mg total) by mouth daily.   Vitamin D  (Ergocalciferol ) 1.25 MG (50000 UNIT) Caps capsule Commonly known as: DRISDOL  Take 50,000 Units by mouth once a week.   VITAMIN D3 PO Take 1 capsule by mouth daily.        Follow-up Information     Adelia Homestead, MD. Schedule an appointment as soon as possible for a visit in 1 week(s).   Specialty: Internal Medicine Contact information: [address] [place] [address] [phone]                Allergies  Allergen Reactions   Chlorthalidone  Other (See Comments)    Hearing loss   Lisinopril  Cough    Consultations: None   Procedures/Studies: ECHOCARDIOGRAM COMPLETE Result Date: 01/22/2024    ECHOCARDIOGRAM REPORT   Nathan Bradford Name:   TALLY EHLKE Date of Exam: 01/22/2024 Medical Rec #:  [id number]    Height:       72.0 in Accession #:    [id number]   Weight:       295.0 lb Date of Birth:  1991/12/11    BSA:          2.513 m Nathan Bradford Age:    33 years     BP:           166/103 mmHg Nathan Bradford Gender: M            HR:           90 bpm. Exam Location:  Inpatient Procedure: 2D Echo, Cardiac Doppler and Color Doppler Indications:    Syncope R55  History:        Nathan Bradford has prior history of Echocardiogram examinations, most                 recent 01/29/2019. Risk Factors:Hypertension and Diabetes.  Sonographer:    Astrid Blamer Referring Phys:  [phone] DEBBY CROSLEY IMPRESSIONS  1. Left ventricular ejection fraction, by estimation, is 55%. The left ventricle has normal function. The left ventricle has no regional wall motion abnormalities. There is severe concentric left ventricular hypertrophy. Left ventricular diastolic parameters are indeterminate.  2. Right ventricular systolic function is normal. The right ventricular size is normal. Tricuspid regurgitation signal is inadequate for assessing PA pressure.  3. The mitral valve is normal in structure. No evidence of mitral valve regurgitation. No evidence of mitral stenosis.  4. The aortic valve is tricuspid. Aortic valve regurgitation is not visualized. No aortic stenosis is present.  5. The inferior  vena cava is normal in size with greater than 50% respiratory variability, suggesting right atrial pressure of 3 mmHg. Comparison(s): No prior Echocardiogram. FINDINGS  Left Ventricle: Left ventricular ejection fraction, by estimation, is 55%. The left ventricle has normal function. The left ventricle has no regional wall motion abnormalities. The left ventricular internal cavity size was normal in size. There is severe concentric left ventricular hypertrophy. Left ventricular diastolic parameters are indeterminate. Right Ventricle: The right ventricular size is normal. No increase in right ventricular wall thickness. Right ventricular systolic function is normal. Tricuspid regurgitation signal is inadequate for assessing PA pressure. Left Atrium: Left atrial size was normal in size. Right Atrium: Right atrial size was normal in size. Pericardium: There is no evidence of pericardial effusion. Mitral Valve: The mitral valve is normal in structure. No evidence of mitral valve regurgitation. No evidence of mitral valve stenosis. Tricuspid Valve: The tricuspid valve is normal in structure. Tricuspid valve regurgitation is not demonstrated. No evidence of tricuspid stenosis. Aortic Valve: The aortic valve is  tricuspid. Aortic valve regurgitation is not visualized. No aortic stenosis is present. Aortic valve mean gradient measures 5.0 mmHg. Aortic valve peak gradient measures 8.8 mmHg. Aortic valve area, by VTI measures 2.23 cm. Pulmonic Valve: The pulmonic valve was grossly normal. Pulmonic valve regurgitation is trivial. No evidence of pulmonic stenosis. Aorta: The aortic root and ascending aorta are structurally normal, with no evidence of dilitation. Venous: The inferior vena cava is normal in size with greater than 50% respiratory variability, suggesting right atrial pressure of 3 mmHg. IAS/Shunts: The interatrial septum was not well visualized.  LEFT VENTRICLE PLAX 2D LVIDd:         5.70 cm LVIDs:         4.40 cm LV PW:         1.40 cm LV IVS:        1.50 cm LVOT diam:     2.00 cm LV SV:         63 LV SV Index:   25 LVOT Area:     3.14 cm  RIGHT VENTRICLE RV S prime:     13.70 cm/s TAPSE (M-mode): 4.1 cm LEFT ATRIUM             Index        RIGHT ATRIUM           Index LA Vol (A2C):   69.3 ml 27.57 ml/m  RA Area:     18.00 cm LA Vol (A4C):   64.5 ml 25.66 ml/m  RA Volume:   44.70 ml  17.79 ml/m LA Biplane Vol: 69.8 ml 27.77 ml/m  AORTIC VALVE AV Area (Vmax):    2.53 cm AV Area (Vmean):   2.42 cm AV Area (VTI):     2.23 cm AV Vmax:           148.00 cm/s AV Vmean:          109.000 cm/s AV VTI:            0.282 m AV Peak Grad:      8.8 mmHg AV Mean Grad:      5.0 mmHg LVOT Vmax:         119.00 cm/s LVOT Vmean:        83.800 cm/s LVOT VTI:          0.200 m LVOT/AV VTI ratio: 0.71  AORTA Ao Root diam: 3.70 cm MITRAL VALVE MV Area (PHT): 2.54 cm     SHUNTS  MV Decel Time: 299 msec     Systemic VTI:  0.20 m MV E velocity: 111.00 cm/s  Systemic Diam: 2.00 cm Vishnu Priya Mallipeddi Electronically signed by Lucetta Russel Mallipeddi Signature Date/Time: 01/22/2024/12:56:39 PM    Final    MR BRAIN WO CONTRAST Result Date: 01/22/2024 CLINICAL DATA:  Initial evaluation for acute seizure. EXAM: MRI HEAD WITHOUT CONTRAST  TECHNIQUE: Multiplanar, multiecho pulse sequences of the brain and surrounding structures were obtained without intravenous contrast. COMPARISON:  CT from earlier the same day. FINDINGS: Brain: Cerebral volume within normal limits. Scattered patchy T2/FLAIR hyperintensity involving the periventricular and deep white matter both cerebral hemispheres, most likely related chronic microvascular ischemic disease, mild in nature, but advanced for age. No abnormal foci of restricted diffusion to suggest acute or subacute ischemia. Gray-white matter digestion maintained. Nor is of chronic cortical infarction. No acute or chronic intracranial blood products. No mass lesion, midline shift or mass effect. No hydrocephalus or extra-axial fluid collection. Pituitary gland and suprasellar region within normal limits. Vascular: Major intracranial vascular flow voids are maintained. Skull and upper cervical spine: Craniocervical junction within normal limits. Decreased T1 signal intensity noted within the visualized bone marrow, nonspecific, but most commonly related to anemia, smoking or obesity. No scalp soft tissue abnormality. Sinuses/Orbits: Globes orbital soft tissues within normal limits. Left maxillary sinus retention cyst noted. No significant mastoid effusion. Other: None. IMPRESSION: 1. No acute intracranial abnormality. 2. Mild cerebral white matter disease, nonspecific, but most likely related chronic microvascular ischemic disease. Although changes are mild in nature, appearance is advanced for age. Electronically Signed   By: Virgia Griffins M.D.   On: 01/22/2024 00:57   CT ANGIO ABDOMEN W &/OR WO CONTRAST Result Date: 01/22/2024 CLINICAL DATA:  Uncontrolled hypertension. Evaluate for renal artery stenosis. EXAM: CT ANGIOGRAPHY ABDOMEN TECHNIQUE: Multidetector CT imaging of the abdomen was performed using the standard protocol during bolus administration of intravenous contrast. Multiplanar reconstructed  images and MIPs were obtained and reviewed to evaluate the vascular anatomy. RADIATION DOSE REDUCTION: This exam was performed according to the departmental dose-optimization program which includes automated exposure control, adjustment of the mA and/or kV according to Nathan Bradford size and/or use of iterative reconstruction technique. CONTRAST:  75mL OMNIPAQUE  IOHEXOL  350 MG/ML SOLN COMPARISON:  None Available. FINDINGS: VASCULAR Aorta: Normal. Celiac: Patent without evidence of aneurysm, dissection, vasculitis or significant stenosis. There is variant anatomy with a hepato duodenal common trunk arising just to the right of the celiac trunk. There is no branch occlusion. Left gastric artery arises normally from the celiac artery. SMA: Normal. No branch occlusion as far seen. The entire pelvis was not scanned is only an abdominal CT was requested. Renals: Both renal arteries are patent without evidence of aneurysm, dissection, vasculitis, fibromuscular dysplasia or significant stenosis. IMA: Normal. Inflow: There are small amounts of scattered calcific plaque in the common iliac and bilateral internal iliac arteries, but this is unusual in a 33 year old warranting clinical correlation. The visualized inflow arteries are widely patent with imaging stopped at about the proximal 1/3 of the internal and external iliac arteries. Veins: No obvious venous abnormality within the limitations of this arterial phase study. There is a normal variant circumaortic left renal vein. Review of the MIP images confirms the above findings. NON-VASCULAR Lower chest: There are posterior atelectatic changes in the lung bases without infiltrates. Mild elevation right hemidiaphragm. Mild cardiomegaly with a left chamber predominance and calcifications in the right coronary artery, both findings also unusual in a 33 year old. There are  mild thickened bronchi in both lower lobes. Hepatobiliary: The liver is 18 cm length and mildly steatotic. There  are no mass enhancement. Gallbladder and bile ducts are unremarkable. Pancreas: No abnormality. Spleen: No abnormality. Adrenals/Urinary Tract: Adrenal glands are unremarkable. Kidneys are normal, without renal calculi, focal lesion, or hydronephrosis. Bladder is not imaged. Stomach/Bowel: No dilatation or wall thickening including the retrocecal appendix. Lymphatic: No enlarged lymph nodes are seen in the abdomen. Other: Small umbilical fat hernia. No incarcerated hernia. No free hemorrhage, free air, or free fluid. No focal inflammatory process or localizing collections. Musculoskeletal: No acute or significant osseous findings. IMPRESSION: 1. No evidence of renal artery stenosis or fibromuscular dysplasia. 2. Small amounts of scattered calcific plaque in the common iliac and bilateral internal iliac arteries, unusual in a 33 year old warranting clinical correlation. 3. Mild cardiomegaly with right coronary artery calcifications, also unusual in a 33 year old. 4. Mildly steatotic liver. 5. No acute abdominal findings. 6. Small umbilical fat hernia. 7. Mild thickened bronchi in both lower lobes. Electronically Signed   By: Denman Fischer M.D.   On: 01/22/2024 00:52   CT Head Wo Contrast Result Date: 01/21/2024 CLINICAL DATA:  New onset seizure. EXAM: CT HEAD WITHOUT CONTRAST TECHNIQUE: Contiguous axial images were obtained from the base of the skull through the vertex without intravenous contrast. RADIATION DOSE REDUCTION: This exam was performed according to the departmental dose-optimization program which includes automated exposure control, adjustment of the mA and/or kV according to Nathan Bradford size and/or use of iterative reconstruction technique. COMPARISON:  04/28/2007 FINDINGS: Brain: No evidence of intracranial hemorrhage, acute infarction, hydrocephalus, extra-axial collection, or mass lesion/mass effect. Vascular:  No hyperdense vessel or other acute findings. Skull: No evidence of fracture or other  significant bone abnormality. Sinuses/Orbits:  No acute findings. Other: None. IMPRESSION: Negative noncontrast head CT. Electronically Signed   By: Marlyce Sine M.D.   On: 01/21/2024 17:40   DG Chest 2 View Result Date: 01/21/2024 CLINICAL DATA:  Chest pain in.  Seizure. EXAM: CHEST - 2 VIEW COMPARISON:  01/04/2023 FINDINGS: Low lung volumes. Cardiopericardial silhouette is at upper limits of normal for size. Mild vascular congestion without edema. No focal consolidation or pleural effusion. No acute bony abnormality. IMPRESSION: Low volume film with mild vascular congestion. Electronically Signed   By: Donnal Fusi M.D.   On: 01/21/2024 14:01      Subjective:  Nathan Bradford seen and examined at bedside today.  Hemodynamically stable.  Comfortable.  No new complaints.  Headache has resolved.  Blood pressure acceptable this morning.  Medically stable for discharge home today. Discharge Exam: Vitals:   01/23/24 0233 01/23/24 0532  BP: (!) 151/104 (!) 151/103  Pulse: 76 78  Resp: 16 17  Temp: 97.8 F (36.6 C) 98 F (36.7 C)  SpO2: 99% 98%   Vitals:   01/22/24 2339 01/23/24 0114 01/23/24 0233 01/23/24 0532  BP: (!) 173/113 (!) 159/95 (!) 151/104 (!) 151/103  Pulse: 84 75 76 78  Resp: 16  16 17   Temp: 98.3 F (36.8 C)  97.8 F (36.6 C) 98 F (36.7 C)  TempSrc: Oral  Oral Oral  SpO2: 98%  99% 98%  Weight:      Height:        General: Pt is alert, awake, not in acute distress,obese Cardiovascular: RRR, S1/S2 +, no rubs, no gallops Respiratory: CTA bilaterally, no wheezing, no rhonchi Abdominal: Soft, NT, ND, bowel sounds + Extremities: no edema, no cyanosis    The results of significant diagnostics from  this hospitalization (including imaging, microbiology, ancillary and laboratory) are listed below for reference.     Microbiology: No results found for this or any previous visit (from the past 240 hours).   Labs: BNP (last 3 results) Recent Labs    01/21/24 2130  BNP 67.9    Basic Metabolic Panel: Recent Labs  Lab 01/21/24 1341 01/22/24 0726 01/23/24 0547  NA 140 138 136  K 3.2* 3.1* 3.1*  CL 105 102 101  CO2 26 28 28   GLUCOSE 85 93 97  BUN 14 14 14   CREATININE 1.41* 1.17 1.17  CALCIUM  8.9 8.7* 8.8*  MG  --  2.1  --    Liver Function Tests: No results for input(s): "AST", "ALT", "ALKPHOS", "BILITOT", "PROT", "ALBUMIN" in the last 168 hours. No results for input(s): "LIPASE", "AMYLASE" in the last 168 hours. No results for input(s): "AMMONIA" in the last 168 hours. CBC: Recent Labs  Lab 01/21/24 1341 01/22/24 0726  WBC 4.3 4.3  NEUTROABS  --  2.7  HGB 15.3 14.4  HCT 46.6 44.7  MCV 88.3 88.0  PLT 319 297   Cardiac Enzymes: Recent Labs  Lab 01/21/24 2130  CKTOTAL 165   BNP: Invalid input(s): "POCBNP" CBG: No results for input(s): "GLUCAP" in the last 168 hours. D-Dimer No results for input(s): "DDIMER" in the last 72 hours. Hgb A1c No results for input(s): "HGBA1C" in the last 72 hours. Lipid Profile No results for input(s): "CHOL", "HDL", "LDLCALC", "TRIG", "CHOLHDL", "LDLDIRECT" in the last 72 hours. Thyroid  function studies No results for input(s): "TSH", "T4TOTAL", "T3FREE", "THYROIDAB" in the last 72 hours.  Invalid input(s): "FREET3" Anemia work up No results for input(s): "VITAMINB12", "FOLATE", "FERRITIN", "TIBC", "IRON", "RETICCTPCT" in the last 72 hours. Urinalysis    Component Value Date/Time   COLORURINE YELLOW 04/11/2009 2038   APPEARANCEUR CLEAR 04/11/2009 2038   LABSPEC 1.033 (H) 04/11/2009 2038   PHURINE 6.5 04/11/2009 2038   GLUCOSEU NEGATIVE 04/11/2009 2038   HGBUR NEGATIVE 04/11/2009 2038   BILIRUBINUR negative 12/27/2018 1416   KETONESUR negative 12/27/2018 1416   KETONESUR NEGATIVE 04/11/2009 2038   PROTEINUR NEGATIVE 04/11/2009 2038   UROBILINOGEN 0.2 12/27/2018 1416   UROBILINOGEN 1.0 04/11/2009 2038   NITRITE Negative 12/27/2018 1416   NITRITE NEGATIVE 04/11/2009 2038   LEUKOCYTESUR Negative  12/27/2018 1416   Sepsis Labs Recent Labs  Lab 01/21/24 1341 01/22/24 0726  WBC 4.3 4.3   Microbiology No results found for this or any previous visit (from the past 240 hours).  Please note: You were cared for by a hospitalist during your hospital stay. Once you are discharged, your primary care physician will handle any further medical issues. Please note that NO REFILLS for any discharge medications will be authorized once you are discharged, as it is imperative that you return to your primary care physician (or establish a relationship with a primary care physician if you do not have one) for your post hospital discharge needs so that they can reassess your need for medications and monitor your lab values.    Time coordinating discharge: 40 minutes  SIGNED:   Leona Rake, MD  [facility] 01/23/2024, 10:26 AM Pager [phone]  If 7PM-7AM, please contact night-coverage [email or web address] Password [email or web address]

## 2024-01-24 ENCOUNTER — Telehealth: Payer: Self-pay

## 2024-01-24 LAB — METANEPHRINES, PLASMA
Metanephrine, Free: <25 pg/mL (ref 0.0–88.0)
Normetanephrine, Free: 41.3 pg/mL (ref 0.0–210.1)

## 2024-01-24 NOTE — Transitions of Care (Post Inpatient/ED Visit) (Signed)
01/24/2024  Name: Nathan Bradford. MRN: [id number] DOB: 1991-11-20  Today's TOC FU Call Status: Today's TOC FU Call Status:: Successful TOC FU Call Completed Unsuccessful Call (1st Attempt) Date: 01/24/24 [facility] FU Call Complete Date: 01/24/24 Patient's Name and Date of Birth confirmed.  Transition Care Management Follow-up Telephone Call Date of Discharge: 01/23/24 Discharge Facility: [facility]) Type of Discharge: Inpatient Admission Primary Inpatient Discharge Diagnosis:: hypertension How have you been since you were released from the hospital?: Better Any questions or concerns?: No  Items Reviewed: Did you receive and understand the discharge instructions provided?: Yes Medications obtained,verified, and reconciled?: Yes (Medications Reviewed) Any new allergies since your discharge?: No Dietary orders reviewed?: Yes Do you have support at home?: No  Medications Reviewed Today: Medications Reviewed Today     Reviewed by Karena Addison, LPN (Licensed Practical Nurse) on 01/24/24 at 1106  Med List Status: <None>   Medication Order Taking? Sig Documenting Provider Last Dose Status Informant  Acetaminophen 500 MG capsule [id number]  Take 500-1,000 mg by mouth every 6 (six) hours as needed for pain or fever. [provider]  Active Self  albuterol (VENTOLIN HFA) 108 (90 Base) MCG/ACT inhaler [id number] No Inhale 2 puffs into the lungs every 6 (six) hours as needed for wheezing or shortness of breath. Myrlene Broker, MD Taking Active Self  ALPRAZolam Prudy Feeler) 0.25 MG tablet [id number] No Take 1 tablet (0.25 mg total) by mouth daily as needed for anxiety. Myrlene Broker, MD Taking Active Self  amLODipine (NORVASC) 10 MG tablet [id number]  Take 1 tablet (10 mg total) by mouth daily. Burnadette Pop, MD  Active   atorvastatin (LIPITOR) 20 MG tablet [id number] No Take 1 tablet (20 mg total) by mouth daily.  Patient not taking: Reported on 01/21/2024    Myrlene Broker, MD Not Taking Active Self           Med Note Salvatore Marvel   Sat Jan 21, 2024  9:28 PM)    Cholecalciferol (VITAMIN D3 PO) [id number] No Take 1 capsule by mouth daily. [provider] Past Week Active Self           Med Note Salvatore Marvel   Sat Jan 21, 2024  9:31 PM) Strength not noted  indapamide (LOZOL) 2.5 MG tablet [id number]  Take 1 tablet (2.5 mg total) by mouth daily. Burnadette Pop, MD  Active   labetalol (NORMODYNE) 200 MG tablet [id number]  Take 1 tablet (200 mg total) by mouth 3 (three) times daily. Burnadette Pop, MD  Active   loratadine (CLARITIN) 10 MG tablet [id number] No Take 1 tablet (10 mg total) by mouth daily.  Patient taking differently: Take 10 mg by mouth daily as needed for allergies or rhinitis.   Myrlene Broker, MD Taking Active Self  potassium chloride SA (KLOR-CON M) 20 MEQ tablet [id number]  Take 1 tablet (20 mEq total) by mouth 2 (two) times daily for 7 days. Burnadette Pop, MD  Active   valsartan (DIOVAN) 320 MG tablet [id number]  Take 1 tablet (320 mg total) by mouth daily. Burnadette Pop, MD  Active   Vitamin D, Ergocalciferol, (DRISDOL) 1.25 MG (50000 UNIT) CAPS capsule [id number] No Take 50,000 Units by mouth once a week. [provider] Taking Active Self           Med Note Salvatore Marvel   Sat Jan 21, 2024  9:25 PM) PROVIDER- Although the patient said he takes this, it appears to have  not been filled in the past 14 months.            Home Care and Equipment/Supplies: Were Home Health Services Ordered?: NA Any new equipment or medical supplies ordered?: NA  Functional Questionnaire: Do you need assistance with bathing/showering or dressing?: No Do you need assistance with meal preparation?: No Do you need assistance with eating?: No Do you have difficulty maintaining continence: No Do you need assistance with getting out of bed/getting out of a chair/moving?: No Do you have difficulty managing or taking  your medications?: No  Follow up appointments reviewed: PCP Follow-up appointment confirmed?: Yes Date of PCP follow-up appointment?: 01/25/24 Follow-up Provider: [facility] Follow-up appointment confirmed?: NA Do you need transportation to your follow-up appointment?: No Do you understand care options if your condition(s) worsen?: Yes-patient verbalized understanding    SIGNATURE Karena Addison, LPN [facility] Nurse Health Advisor Direct Dial [phone]

## 2024-01-24 NOTE — Transitions of Care (Post Inpatient/ED Visit) (Signed)
   01/24/2024  Name: Nathan Bradford. MRN: [id number] DOB: [birth year]  Today's TOC FU Call Status: Today's TOC FU Call Status:: Unsuccessful Call (1st Attempt) Unsuccessful Call (1st Attempt) Date: 01/24/24  Attempted to reach the patient regarding the most recent Inpatient/ED visit.  Follow Up Plan: Additional outreach attempts will be made to reach the patient to complete the Transitions of Care (Post Inpatient/ED visit) call.   Signature Karena Addison, LPN [facility] Nurse Health Advisor Direct Dial [phone]

## 2024-01-25 ENCOUNTER — Inpatient Hospital Stay: Payer: BC Managed Care – PPO | Admitting: Internal Medicine

## 2024-01-26 LAB — 5 HIAA, QUANTITATIVE, URINE, 24 HOUR
5-HIAA, Ur: 3.4 mg/L
5-HIAA,Quant.,24 Hr Urine: 8 mg/(24.h) (ref 0.0–14.9)
Total Volume: 2350

## 2024-01-31 ENCOUNTER — Telehealth: Payer: Self-pay

## 2024-01-31 LAB — METANEPHRINES, URINE, 24 HOUR
Metaneph Total, Ur: 86 ug/L
Metanephrines, 24H Ur: 202 ug/(24.h) (ref 58–276)
Normetanephrine, 24H Ur: 402 ug/(24.h) (ref 156–729)
Normetanephrine, Ur: 171 ug/L
Total Volume: 2350

## 2024-01-31 NOTE — Telephone Encounter (Signed)
Copied from CRM [id number]. Topic: Clinical - Medical Advice >> Jan 31, 2024  1:03 PM Taleah C wrote: Reason for CRM: pt called and stated that he needs documentation to return back to work full time post hospital visit. Please callback and advise.

## 2024-02-03 LAB — ALDOSTERONE + RENIN ACTIVITY W/ RATIO
ALDO / PRA Ratio: 2.8 (ref 0.0–30.0)
Aldosterone: 3.6 ng/dL (ref 0.0–30.0)
PRA LC/MS/MS: 1.301 ng/mL/h (ref 0.167–5.380)

## 2024-02-06 ENCOUNTER — Inpatient Hospital Stay: Payer: BC Managed Care – PPO | Admitting: Internal Medicine

## 2024-02-20 ENCOUNTER — Ambulatory Visit (INDEPENDENT_AMBULATORY_CARE_PROVIDER_SITE_OTHER): Payer: BC Managed Care – PPO | Admitting: Internal Medicine

## 2024-02-20 ENCOUNTER — Encounter: Payer: Self-pay | Admitting: Internal Medicine

## 2024-02-20 VITALS — BP 160/120 | HR 72 | Ht 72.0 in | Wt 289.0 lb

## 2024-02-20 DIAGNOSIS — R7303 Prediabetes: Secondary | ICD-10-CM | POA: Diagnosis not present

## 2024-02-20 DIAGNOSIS — F3177 Bipolar disorder, in partial remission, most recent episode mixed: Secondary | ICD-10-CM | POA: Diagnosis not present

## 2024-02-20 DIAGNOSIS — G44201 Tension-type headache, unspecified, intractable: Secondary | ICD-10-CM

## 2024-02-20 DIAGNOSIS — I10 Essential (primary) hypertension: Secondary | ICD-10-CM | POA: Diagnosis not present

## 2024-02-20 DIAGNOSIS — F411 Generalized anxiety disorder: Secondary | ICD-10-CM | POA: Diagnosis not present

## 2024-02-20 LAB — COMPREHENSIVE METABOLIC PANEL
ALT: 18 U/L (ref 0–53)
AST: 20 U/L (ref 0–37)
Albumin: 4.4 g/dL (ref 3.5–5.2)
Alkaline Phosphatase: 50 U/L (ref 39–117)
BUN: 15 mg/dL (ref 6–23)
CO2: 31 meq/L (ref 19–32)
Calcium: 9.3 mg/dL (ref 8.4–10.5)
Chloride: 102 meq/L (ref 96–112)
Creatinine, Ser: 1.2 mg/dL (ref 0.40–1.50)
GFR: 80.23 mL/min (ref >60.00)
Glucose, Bld: 99 mg/dL (ref 70–99)
Potassium: 3.5 meq/L (ref 3.5–5.1)
Sodium: 139 meq/L (ref 135–145)
Total Bilirubin: 0.9 mg/dL (ref 0.2–1.2)
Total Protein: 7.3 g/dL (ref 6.0–8.3)

## 2024-02-20 LAB — CBC
HCT: 45.5 % (ref 39.0–52.0)
Hemoglobin: 14.9 g/dL (ref 13.0–17.0)
MCHC: 32.7 g/dL (ref 30.0–36.0)
MCV: 88.5 fl (ref 78.0–100.0)
Platelets: 340 10*3/uL (ref 150.0–400.0)
RBC: 5.14 Mil/uL (ref 4.22–5.81)
RDW: 14.1 % (ref 11.5–15.5)
WBC: 3.8 10*3/uL — ABNORMAL LOW (ref 4.0–10.5)

## 2024-02-20 LAB — HEMOGLOBIN A1C: Hgb A1c MFr Bld: 6 % (ref 4.6–6.5)

## 2024-02-20 NOTE — Progress Notes (Unsigned)
   Subjective:   Patient ID: Nathan Bradford., male    DOB: [birth year], 33 y.o.   MRN: [id number]  HPI The patient is a 33 YO man last seen more than 1 year ago. Recently in hospital for hypertensive emergency. Discharged 01/22/24 advised to take indapamide and labetalol and valsartan and amlodipine for BP. Is taking labetalol and valsartan able to tell me dose and interval. Does not know what indapamide and amlodipine are but maybe taking one of those. Knows that he did not fill the other one. Maybe amlodipine.  PMH, [facility], social history reviewed and updated  Review of Systems  Objective:  Physical Exam  Vitals:   02/20/24 0845  BP: (!) 160/120  Pulse: 72  TempSrc: Oral  SpO2: 98%  Weight: 289 lb (131.1 kg)  Height: 6' (1.829 m)    Assessment & Plan:

## 2024-02-20 NOTE — Patient Instructions (Addendum)
 Your blood pressure is really high today and we want you to start the indapamide 2.5 mg daily to help lower this. High blood pressure does damage your body over time. It can damage your heart and your brain and your kidneys.  We have refilled the blood pressure medicines so you can keep taking the and we want you to keep taking amlodipine, valsartan, indapamide, labetalolol.   We have sent in fluoxetine to take daily for the pain attacks.  We are checking the labs today.  We will have a Child psychotherapist to help with resources for you.

## 2024-02-21 ENCOUNTER — Encounter: Payer: Self-pay | Admitting: Internal Medicine

## 2024-02-21 ENCOUNTER — Telehealth: Payer: Self-pay | Admitting: *Deleted

## 2024-02-21 NOTE — Progress Notes (Unsigned)
 Complex Care Management Note Care Guide Note  02/21/2024 Name: Nathan Bradford. MRN: [id number] DOB: 26-Dec-1990   Complex Care Management Outreach Attempts: An unsuccessful telephone outreach was attempted today to offer the patient information about available complex care management services.  Follow Up Plan:  Additional outreach attempts will be made to offer the patient complex care management information and services.   Encounter Outcome:  No Answer  Burman Nieves, CMA, Care Guide [facility]  [facility], [facility] Guide Direct Dial: [phone]  Fax: [phone] Website: [email or web address]

## 2024-02-22 NOTE — Progress Notes (Signed)
 Complex Care Management Note  Care Guide Note 02/22/2024 Name: Ashawn Rinehart. MRN: [id number] DOB: [birth year]  Wendee Copp Ahlijah Raia. is a 33 y.o. year old male who sees Myrlene Broker, MD for primary care. I reached out to [employer]. by phone today to offer complex care management services.  Mr. Jaworski was given information about Complex Care Management services today including:   The Complex Care Management services include support from the care team which includes your Nurse Care Manager, Clinical Social Worker, or Pharmacist.  The Complex Care Management team is here to help remove barriers to the health concerns and goals most important to you. Complex Care Management services are voluntary, and the patient may decline or stop services at any time by request to their care team member.   Complex Care Management Consent Status: Patient agreed to services and verbal consent obtained.   Follow up plan:  Telephone appointment with complex care management team member scheduled for:  03/05/2024  Encounter Outcome:  Patient Scheduled  Burman Nieves, CMA, Care Guide [employer] [facility]  [facility], [facility] Guide Direct Dial: [phone]  Fax: [phone] Website: [email or web address]

## 2024-02-23 ENCOUNTER — Encounter: Payer: Self-pay | Admitting: Internal Medicine

## 2024-02-23 ENCOUNTER — Other Ambulatory Visit: Payer: Self-pay | Admitting: Internal Medicine

## 2024-02-23 MED ORDER — VALSARTAN 320 MG PO TABS: 320.0000 mg | ORAL_TABLET | Freq: Every day | ORAL | 3 refills | Status: DC

## 2024-02-23 MED ORDER — AMLODIPINE BESYLATE 10 MG PO TABS: 10.0000 mg | ORAL_TABLET | Freq: Every day | ORAL | 3 refills | Status: DC

## 2024-02-23 MED ORDER — FLUOXETINE HCL 20 MG PO TABS: 20.0000 mg | ORAL_TABLET | Freq: Every day | ORAL | 0 refills | Status: DC

## 2024-02-23 MED ORDER — INDAPAMIDE 2.5 MG PO TABS: 2.5000 mg | ORAL_TABLET | Freq: Every day | ORAL | 3 refills | Status: AC

## 2024-02-23 NOTE — Assessment & Plan Note (Signed)
 Likely related to high BP and encouraged the seriousness of control of this.

## 2024-02-23 NOTE — Assessment & Plan Note (Signed)
 With pressured speech today and suspect lack of meds has caused overall decline in self care. Rx prozac 20 mg daily to start for mood control.

## 2024-02-23 NOTE — Assessment & Plan Note (Signed)
Checking HgA1c as due.

## 2024-02-23 NOTE — Assessment & Plan Note (Signed)
 Severely elevated today and counseled that he has kidney damage from this and is at high risk of morbidity related. He is not taking meds as prescribed from hospital due to not staying on the same side of town. He has financial distress and unsafe living environment and needs referral to assistance which is done today. He is asked to fill indapamide 2.5 mg daily and start taking (BP was controlled on this in hospital) and continue amlpdipine 10 mg daily and labetalol 200 mg TID and valsartan 320 mg daily. Follow up 2-3 weeks. Checking CMP.

## 2024-02-23 NOTE — Assessment & Plan Note (Signed)
 Starting prozac 20 mg daily. Had been using alprazolam but this was making him feel bad so he stopped on his own some time ago.

## 2024-03-05 ENCOUNTER — Other Ambulatory Visit: Payer: Self-pay

## 2024-03-05 NOTE — Patient Outreach (Signed)
  Medicaid Managed Care   Unsuccessful Outreach Note  03/05/2024 Name: Nathan Bradford. MRN: [id number] DOB: August 22, 1991  Referred by: Myrlene Broker, MD Reason for referral : High Risk Managed Medicaid (MM social work unsuccessful telephone outreach)   An unsuccessful telephone outreach was attempted today. The patient was referred to the case management team for assistance with care management and care coordination.   Follow Up Plan: A HIPAA compliant phone message was left for the patient providing contact information and requesting a return call.   Abelino Derrick, MHA [facility]  Managed [facility] Social Worker [phone]

## 2024-03-05 NOTE — Patient Instructions (Signed)
  Medicaid Managed Care   Unsuccessful Outreach Note  03/05/2024 Name: Nathan Blank. MRN: [id number] DOB: August 22, 1991  Referred by: Myrlene Broker, MD Reason for referral : High Risk Managed Medicaid (MM social work unsuccessful telephone outreach)   An unsuccessful telephone outreach was attempted today. The patient was referred to the case management team for assistance with care management and care coordination.   Follow Up Plan: A HIPAA compliant phone message was left for the patient providing contact information and requesting a return call.   Abelino Derrick, MHA [facility]  Managed [facility] Social Worker [phone]

## 2024-04-02 ENCOUNTER — Other Ambulatory Visit: Payer: Self-pay

## 2024-04-12 ENCOUNTER — Telehealth: Payer: Self-pay | Admitting: *Deleted

## 2024-04-12 NOTE — Progress Notes (Signed)
 Complex Care Management Care Guide Note  04/12/2024 Name: Nathan Bradford. MRN: [id number] DOB: 08-Mar-1991  Nathan Bradford Nathan Bradford. is a 33 y.o. year old male who is a primary care patient of Marquist, Dumaine, MD and is actively engaged with the care management team. I reached out to [employer]. by phone today to assist with re-scheduling  with the Licensed Clinical Social Worker.  Follow up plan: Unsuccessful telephone outreach attempt made. A HIPAA compliant phone message was left for the patient providing contact information and requesting a return call.  Barnie Bora  [facility]  Value-Based Care Institute, [facility] Guide  Direct Dial: [phone]  Fax [phone]

## 2024-04-13 NOTE — Progress Notes (Signed)
 Complex Care Management Care Guide Note  04/13/2024 Name: Nathan Bradford. MRN: [id number] DOB: 03/24/1991  Nathan Bradford. is a 33 y.o. year old male who is a primary care patient of Briceson, Skidgel, MD and is actively engaged with the care management team. I reached out to [employer]. by phone today to assist with re-scheduling  with the BSW.  Follow up plan: Unsuccessful telephone outreach attempt made. A HIPAA compliant phone message was left for the patient providing contact information and requesting a return call. No further outreach attempts will be made at this time. We have been unable to contact the patient to reschedule for complex care management services.   Barnie Bora  [facility]  Value-Based Care Institute, [facility] Guide  Direct Dial: [phone]  Fax [phone]

## 2024-08-25 ENCOUNTER — Other Ambulatory Visit: Payer: Self-pay

## 2024-08-25 ENCOUNTER — Emergency Department (HOSPITAL_COMMUNITY)
Admission: EM | Admit: 2024-08-25 | Discharge: 2024-08-25 | Disposition: A | Discharge status: Home / Self Care | Attending: Emergency Medicine | Admitting: Emergency Medicine

## 2024-08-25 ENCOUNTER — Emergency Department (HOSPITAL_COMMUNITY)

## 2024-08-25 DIAGNOSIS — I10 Essential (primary) hypertension: Secondary | ICD-10-CM | POA: Insufficient documentation

## 2024-08-25 DIAGNOSIS — J45909 Unspecified asthma, uncomplicated: Secondary | ICD-10-CM | POA: Diagnosis not present

## 2024-08-25 DIAGNOSIS — E119 Type 2 diabetes mellitus without complications: Secondary | ICD-10-CM | POA: Insufficient documentation

## 2024-08-25 DIAGNOSIS — R197 Diarrhea, unspecified: Secondary | ICD-10-CM | POA: Insufficient documentation

## 2024-08-25 DIAGNOSIS — R42 Dizziness and giddiness: Secondary | ICD-10-CM

## 2024-08-25 DIAGNOSIS — M25551 Pain in right hip: Secondary | ICD-10-CM | POA: Diagnosis not present

## 2024-08-25 DIAGNOSIS — Z760 Encounter for issue of repeat prescription: Secondary | ICD-10-CM | POA: Diagnosis not present

## 2024-08-25 DIAGNOSIS — I160 Hypertensive urgency: Secondary | ICD-10-CM | POA: Insufficient documentation

## 2024-08-25 DIAGNOSIS — Z79899 Other long term (current) drug therapy: Secondary | ICD-10-CM | POA: Diagnosis not present

## 2024-08-25 LAB — URINE DRUG SCREEN
Amphetamines: NEGATIVE
Barbiturates: NEGATIVE
Benzodiazepines: NEGATIVE
Cocaine: NEGATIVE
Fentanyl: NEGATIVE
Methadone Scn, Ur: NEGATIVE
Opiates: NEGATIVE
Tetrahydrocannabinol: NEGATIVE

## 2024-08-25 LAB — CBC
HCT: 47 % (ref 39.0–52.0)
Hemoglobin: 14.7 g/dL (ref 13.0–17.0)
MCH: 27.6 pg (ref 26.0–34.0)
MCHC: 31.3 g/dL (ref 30.0–36.0)
MCV: 88.2 fL (ref 80.0–100.0)
Platelets: 338 K/uL (ref 150–400)
RBC: 5.33 MIL/uL (ref 4.22–5.81)
RDW: 13.8 % (ref 11.5–15.5)
WBC: 4.9 K/uL (ref 4.0–10.5)
nRBC: 0 % (ref 0.0–0.2)

## 2024-08-25 LAB — COMPREHENSIVE METABOLIC PANEL WITH GFR
ALT: 20 U/L (ref 0–44)
AST: 26 U/L (ref 15–41)
Albumin: 4.2 g/dL (ref 3.5–5.0)
Alkaline Phosphatase: 73 U/L (ref 38–126)
Anion gap: 12 (ref 5–15)
BUN: 14 mg/dL (ref 6–20)
CO2: 26 mmol/L (ref 22–32)
Calcium: 9.1 mg/dL (ref 8.9–10.3)
Chloride: 101 mmol/L (ref 98–111)
Creatinine, Ser: 1.15 mg/dL (ref 0.61–1.24)
GFR, Estimated: >60 mL/min (ref >60)
Glucose, Bld: 106 mg/dL — ABNORMAL HIGH (ref 70–99)
Potassium: 3.2 mmol/L — ABNORMAL LOW (ref 3.5–5.1)
Sodium: 139 mmol/L (ref 135–145)
Total Bilirubin: 1.1 mg/dL (ref 0.0–1.2)
Total Protein: 7.2 g/dL (ref 6.5–8.1)

## 2024-08-25 LAB — URINALYSIS, W/ REFLEX TO CULTURE (INFECTION SUSPECTED)
Bacteria, UA: NONE SEEN
Bilirubin Urine: NEGATIVE
Glucose, UA: NEGATIVE mg/dL
Hgb urine dipstick: NEGATIVE
Ketones, ur: NEGATIVE mg/dL
Leukocytes,Ua: NEGATIVE
Nitrite: NEGATIVE
Protein, ur: 100 mg/dL — AB
Specific Gravity, Urine: 1.019 (ref 1.005–1.030)
pH: 7 (ref 5.0–8.0)

## 2024-08-25 LAB — RESP PANEL BY RT-PCR (RSV, FLU A&B, COVID)  RVPGX2
Influenza A by PCR: NEGATIVE
Influenza B by PCR: NEGATIVE
Resp Syncytial Virus by PCR: NEGATIVE
SARS Coronavirus 2 by RT PCR: NEGATIVE

## 2024-08-25 LAB — TROPONIN T, HIGH SENSITIVITY
Troponin T High Sensitivity: 17 ng/L (ref 0–19)
Troponin T High Sensitivity: 16 ng/L (ref 0–19)

## 2024-08-25 LAB — ETHANOL: Alcohol, Ethyl (B): <15 mg/dL (ref <15)

## 2024-08-25 MED ORDER — LABETALOL HCL 200 MG PO TABS: 200.0000 mg | ORAL_TABLET | Freq: Three times a day (TID) | ORAL | 0 refills | Status: AC

## 2024-08-25 MED ORDER — AMLODIPINE BESYLATE 10 MG PO TABS: 10.0000 mg | ORAL_TABLET | Freq: Every day | ORAL | 3 refills | Status: AC

## 2024-08-25 MED ORDER — SODIUM CHLORIDE 0.9 % IV BOLUS
500.0000 mL | Freq: Once | INTRAVENOUS | Status: AC
  Administered 2024-08-25: 500 mL via INTRAVENOUS

## 2024-08-25 MED ORDER — LABETALOL HCL 100 MG PO TABS
200.0000 mg | ORAL_TABLET | Freq: Once | ORAL | Status: AC
  Administered 2024-08-25: 200 mg via ORAL
  Filled 2024-08-25: qty 2

## 2024-08-25 MED ORDER — CLONIDINE HCL 0.1 MG PO TABS
0.1000 mg | ORAL_TABLET | Freq: Once | ORAL | Status: AC
  Administered 2024-08-25: 0.1 mg via ORAL
  Filled 2024-08-25: qty 1

## 2024-08-25 MED ORDER — MECLIZINE HCL 25 MG PO TABS
25.0000 mg | ORAL_TABLET | Freq: Once | ORAL | Status: AC
  Administered 2024-08-25: 25 mg via ORAL
  Filled 2024-08-25: qty 1

## 2024-08-25 MED ORDER — VALSARTAN 320 MG PO TABS: 320.0000 mg | ORAL_TABLET | Freq: Every day | ORAL | 3 refills | Status: AC

## 2024-08-25 NOTE — ED Notes (Signed)
 Pt aware a urine is needed. Urinal at bedside.

## 2024-08-25 NOTE — ED Triage Notes (Signed)
 Patient to ED by EMS from home with c/o vertigo and left side chest pain. He states pain started a week ago. Pain does not radiate into back or arms. He reports feeling weak and dizzy but denies falls or LOC.

## 2024-08-25 NOTE — ED Triage Notes (Signed)
  Patient to ED by EMS from home with c/o vertigo and left side chest pain. He states pain started a week ago. Pain does not radiate into back or arms. He reports feeling weak and dizzy but denies falls or LOC. Pt BP 222/139. He reports nausea and diarrhea. He endorses visual changes and photosensitivity. FAST negative.

## 2024-08-25 NOTE — Discharge Instructions (Addendum)
 Make sure that you take your medications as prescribed.  Make an appointment to follow-up with your primary care doctor.  Return to the emergency room if you have any worsening symptoms.  Your blood pressure has been dangerously elevated.  If you do not take your medications, you are at risk for damaging your kidneys, having a stroke or having a heart attack.

## 2024-08-25 NOTE — ED Provider Notes (Signed)
 [place] EMERGENCY DEPARTMENT AT [facility] Provider Note   CSN: [id number] Arrival date & time: 08/25/24  0800     Patient presents with: Dizziness and Chest Pain   Nathan Bradford. is a 33 y.o. male.   Patient is a 33 year old male with a history of hypertension and bipolar disorder who presents with multiple complaints.  He says he has been feeling fatigued for about 2 weeks.  He said he does not have any energy.  He has had some diarrhea and nausea for the last week or so.  Denies any known fevers.  No vomiting.  No abdominal pain.  He says he feels short of breath although when asking him about this, he says when he gets up to walk he feels lightheaded and fatigued more so than actually short of breath.  Denies any increased leg swelling.  He does have some pain in the left side of his chest.  It is nonradiating.  He does have a history of noncompliance with his medications.  He told me that he has been taking his blood pressure medications although he says he only has one of them because the other when he was trying to get a refill on he never got a refill on.  This has been since March.  However he did tell the nurses that he had not been taking his blood pressure medications.  His blood pressure is noted to be markedly elevated here in the ED.       Prior to Admission medications   Medication Sig Start Date End Date Taking? Authorizing Provider  Acetaminophen  500 MG capsule Take 500-1,000 mg by mouth every 6 (six) hours as needed for pain or fever. Patient not taking: Reported on 02/20/2024    [provider]  albuterol  (VENTOLIN  HFA) 108 (90 Base) MCG/ACT inhaler Inhale 2 puffs into the lungs every 6 (six) hours as needed for wheezing or shortness of breath. 11/16/22   Rollene Almarie LABOR, MD  amLODipine  (NORVASC ) 10 MG tablet Take 1 tablet (10 mg total) by mouth daily. 08/25/24   Lenor Hollering, MD  atorvastatin  (LIPITOR) 20 MG tablet Take 1 tablet (20 mg  total) by mouth daily. Patient not taking: Reported on 01/21/2024 11/16/22   Rollene Almarie LABOR, MD  Cholecalciferol (VITAMIN D3 PO) Take 1 capsule by mouth daily.    [provider]  FLUoxetine  (PROZAC ) 20 MG capsule Take 1 capsule (20 mg total) by mouth daily. 02/24/24   Rollene Almarie LABOR, MD  indapamide  (LOZOL ) 2.5 MG tablet Take 1 tablet (2.5 mg total) by mouth daily. 02/23/24   Rollene Almarie LABOR, MD  labetalol  (NORMODYNE ) 200 MG tablet Take 1 tablet (200 mg total) by mouth 3 (three) times daily. 08/25/24   Lenor Hollering, MD  loratadine  (CLARITIN ) 10 MG tablet Take 1 tablet (10 mg total) by mouth daily. Patient taking differently: Take 10 mg by mouth daily as needed for allergies or rhinitis. 11/16/22   Rollene Almarie LABOR, MD  potassium chloride  SA (KLOR-CON  M) 20 MEQ tablet Take 1 tablet (20 mEq total) by mouth 2 (two) times daily for 7 days. 01/23/24 01/30/24  Jillian Buttery, MD  valsartan  (DIOVAN ) 320 MG tablet Take 1 tablet (320 mg total) by mouth daily. 08/25/24   Lenor Hollering, MD  Vitamin D , Ergocalciferol , (DRISDOL ) 1.25 MG (50000 UNIT) CAPS capsule Take 50,000 Units by mouth once a week. Patient not taking: Reported on 02/20/2024 11/16/22   [provider]    Allergies:  Chlorthalidone  and Lisinopril     Review of Systems  Constitutional:  Positive for fatigue. Negative for chills, diaphoresis and fever.       Disheveled  HENT:  Negative for congestion, rhinorrhea and sneezing.   Eyes: Negative.   Respiratory:  Positive for shortness of breath. Negative for cough and chest tightness.   Cardiovascular:  Positive for chest pain. Negative for leg swelling.  Gastrointestinal:  Positive for diarrhea and nausea. Negative for abdominal pain and vomiting.  Genitourinary:  Negative for difficulty urinating, flank pain and frequency.  Musculoskeletal:  Negative for arthralgias and back pain.  Skin:  Negative for rash.  Neurological:  Positive for light-headedness.  Negative for dizziness, speech difficulty, weakness, numbness and headaches.    Updated Vital Signs BP (!) 178/122   Pulse 80   Temp 98.5 F (36.9 C) (Oral)   Resp (!) 24   Ht 6' (1.829 m)   Wt 124.7 kg   SpO2 97%   BMI 37.30 kg/m   Physical Exam Constitutional:      Appearance: He is well-developed.  HENT:     Head: Normocephalic and atraumatic.  Eyes:     Pupils: Pupils are equal, round, and reactive to light.  Cardiovascular:     Rate and Rhythm: Normal rate and regular rhythm.     Heart sounds: Normal heart sounds.  Pulmonary:     Effort: Pulmonary effort is normal. No respiratory distress.     Breath sounds: Normal breath sounds. No wheezing or rales.  Chest:     Chest wall: No tenderness.  Abdominal:     General: Bowel sounds are normal.     Palpations: Abdomen is soft.     Tenderness: There is no abdominal tenderness. There is no guarding or rebound.  Musculoskeletal:        General: Normal range of motion.     Cervical back: Normal range of motion and neck supple.  Lymphadenopathy:     Cervical: No cervical adenopathy.  Skin:    General: Skin is warm and dry.     Findings: No rash.  Neurological:     Mental Status: He is alert and oriented to person, place, and time.     Comments: Motor 5/5 all extremities Sensation grossly intact to LT all extremities Finger to Nose intact, no pronator drift CN II-XII grossly intact       (all labs ordered are listed, but only abnormal results are displayed) Labs Reviewed  COMPREHENSIVE METABOLIC PANEL WITH GFR - Abnormal; Notable for the following components:      Result Value   Potassium 3.2 (*)    Glucose, Bld 106 (*)    All other components within normal limits  URINALYSIS, W/ REFLEX TO CULTURE (INFECTION SUSPECTED) - Abnormal; Notable for the following components:   Protein, ur 100 (*)    All other components within normal limits  RESP PANEL BY RT-PCR (RSV, FLU A&B, COVID)  RVPGX2  CBC  ETHANOL  URINE  DRUG SCREEN  TROPONIN T, HIGH SENSITIVITY  TROPONIN T, HIGH SENSITIVITY    EKG: EKG Interpretation Date/Time:  Saturday August 25 2024 08:15:52 EDT Ventricular Rate:  86 PR Interval:  199 QRS Duration:  102 QT Interval:  394 QTC Calculation: 472 R Axis:   85  Text Interpretation: Sinus rhythm RSR' in V1 or V2, probably normal variant Repol abnrm suggests ischemia, anterolateral ST elevation, consider anterior injury since last tracing no significant change Confirmed by Lenor Hollering [phone]) on 08/25/2024 8:19:32 AM  Radiology: DG Hip Unilat W or Wo Pelvis 2-3 Views Right Result Date: 08/25/2024 EXAM: 2 or more VIEW(S) XRAY OF THE UNILATERAL HIP 08/25/2024 11:11:00 AM COMPARISON: None available. CLINICAL HISTORY: Hip pain. Pt reported right lateral hip pain over the past month. Pt denied any injury. FINDINGS: BONES AND JOINTS: No acute fracture or focal osseous lesion. The hip joint is maintained. No significant degenerative changes. SOFT TISSUES: The soft tissues are unremarkable. IMPRESSION: 1. No acute findings. Electronically signed by: Waddell Calk MD 08/25/2024 11:34 AM EDT RP Workstation: [id number]   DG Chest 2 View Result Date: 08/25/2024 EXAM: 2 VIEW(S) XRAY OF THE CHEST 08/25/2024 08:42:00 AM COMPARISON: 01/21/2024 CLINICAL HISTORY: Chest Pain. Per chart: c/o vertigo and left side chest pain. He states pain started a week ago. Pain does not radiate into back or arms. He reports feeling weak and dizzy but denies falls or LOC. Pt BP 222/139. He reports nausea and diarrhea. He endorses visual changes and photosensitivity. FAST negative. FINDINGS: LUNGS AND PLEURA: No focal pulmonary opacity. No pleural effusion. No pneumothorax. HEART AND MEDIASTINUM: Cardiomegaly. No acute abnormality of the cardiac and mediastinal silhouettes. BONES AND SOFT TISSUES: No acute osseous abnormality. IMPRESSION: 1. No acute cardiopulmonary pathology. 2. Cardiomegaly. Electronically signed by: Waddell Calk MD 08/25/2024 08:55 AM EDT RP Workstation: [id number]     Procedures   Medications Ordered in the ED  meclizine  (ANTIVERT ) tablet 25 mg (has no administration in time range)  labetalol  (NORMODYNE ) tablet 200 mg (has no administration in time range)  cloNIDine  (CATAPRES ) tablet 0.1 mg (0.1 mg Oral Given 08/25/24 0844)  sodium chloride  0.9 % bolus 500 mL (0 mLs Intravenous Stopped 08/25/24 1228)                                    Medical Decision Making Amount and/or Complexity of Data Reviewed Labs: ordered. Radiology: ordered.  Risk Prescription drug management.   This patient presents to the ED for concern of fatigue, lightheadedness, diarrhea, this involves an extensive number of treatment options, and is a complaint that carries with it a high risk of complications and morbidity.  I considered the following differential and admission for this acute, potentially life threatening condition.  The differential diagnosis includes viral infection, COVID, gastroenteritis, UTI, hypertensive emergency, stroke, ACS  MDM:    Patient is a 33 year old who presents with various complaints including fatigue and dizziness.  His dizziness sounds more lightheadedness rather than vertiginous.  He does not really describe much of a spinning sensation.  He does not have any focal neurologic deficits.  No associated nausea or vomiting.  EKG does not show any ischemic changes.  His labs are grossly nonconcerning.  His blood pressure is markedly elevated.  I suspect he is noncompliant with his medications.  He does not have a headache or other symptoms that would be more concerning for intracranial hemorrhage or press.  No confusion.  No clinical concerns for stroke.  Chest x-ray does not show any concerns for pulmonary edema.  He complained of some pain to his right hip and x-ray was performed which does not show any acute abnormality.  He was given a dose of clonidine  in the ED and his blood pressure  is improving.  He was also given a dose of labetalol  which he takes at home.  Will give him a refill on his medications.  He was discharged home in good condition.  Was encouraged to follow-up with his primary care doctor.  Return precautions were given.  (Labs, imaging, consults)  Labs: I Ordered, and personally interpreted labs.  The pertinent results include: Normal creatinine, normal troponins  Imaging Studies ordered: I ordered imaging studies including chest x-ray, right hip x-ray I independently visualized and interpreted imaging. I agree with the radiologist interpretation  Additional history obtained from chart.  External records from outside source obtained and reviewed including prior notes  Cardiac Monitoring: The patient was maintained on a cardiac monitor.  If on the cardiac monitor, I personally viewed and interpreted the cardiac monitored which showed an underlying rhythm of: Sinus rhythm  Reevaluation: After the interventions noted above, I reevaluated the patient and found that they have :improved  Social Determinants of Health:    Disposition: Discharged to home  Co morbidities that complicate the patient evaluation  Past Medical History:  Diagnosis Date   Anxiety    Asthma    [employer]reesia 09/23/2020   Bipolar 1 disorder (HCC)    Depression    Depression    [employer]reesia 09/23/2020   Diabetes mellitus without complication (HCC)    [employer]ia 09/23/2020   Hypertension    [employer]reesia 09/23/2020   Vertigo      Medicines Meds ordered this encounter  Medications   cloNIDine  (CATAPRES ) tablet 0.1 mg   sodium chloride  0.9 % bolus 500 mL   meclizine  (ANTIVERT ) tablet 25 mg   labetalol  (NORMODYNE ) tablet 200 mg   amLODipine  (NORVASC ) 10 MG tablet    Sig: Take 1 tablet (10 mg total) by mouth daily.    Dispense:  90 tablet    Refill:  3   labetalol  (NORMODYNE ) 200 MG tablet    Sig: Take 1 tablet (200 mg total) by mouth 3 (three) times daily.    Dispense:  180 tablet     Refill:  0   valsartan  (DIOVAN ) 320 MG tablet    Sig: Take 1 tablet (320 mg total) by mouth daily.    Dispense:  90 tablet    Refill:  3    I have reviewed the patients home medicines and have made adjustments as needed  Problem List / ED Course: Problem List Items Addressed This Visit   None Visit Diagnoses       Hypertensive urgency    -  Primary   Relevant Medications   cloNIDine  (CATAPRES ) tablet 0.1 mg (Completed)   labetalol  (NORMODYNE ) tablet 200 mg (Start on 08/25/2024  3:00 PM)   amLODipine  (NORVASC ) 10 MG tablet   labetalol  (NORMODYNE ) 200 MG tablet   valsartan  (DIOVAN ) 320 MG tablet     Dizziness         Essential hypertension       Relevant Medications   cloNIDine  (CATAPRES ) tablet 0.1 mg (Completed)   labetalol  (NORMODYNE ) tablet 200 mg (Start on 08/25/2024  3:00 PM)   amLODipine  (NORVASC ) 10 MG tablet   labetalol  (NORMODYNE ) 200 MG tablet   valsartan  (DIOVAN ) 320 MG tablet                Final diagnoses:  Hypertensive urgency  Dizziness    ED Discharge Orders          Ordered    amLODipine  (NORVASC ) 10 MG tablet  Daily        08/25/24 1449    labetalol  (NORMODYNE ) 200 MG tablet  3 times daily        08/25/24 1449    valsartan  (DIOVAN ) 320 MG tablet  Daily        08/25/24 1449               Lenor Hollering, MD 08/25/24 1511

## 2025-01-03 ENCOUNTER — Encounter (HOSPITAL_COMMUNITY): Payer: Self-pay | Admitting: Emergency Medicine

## 2025-01-03 ENCOUNTER — Ambulatory Visit (HOSPITAL_COMMUNITY)
Admission: EM | Admit: 2025-01-03 | Discharge: 2025-01-03 | Disposition: A | Discharge status: Home / Self Care | Payer: Self-pay | Attending: Nurse Practitioner | Admitting: Nurse Practitioner

## 2025-01-03 DIAGNOSIS — R42 Dizziness and giddiness: Secondary | ICD-10-CM

## 2025-01-03 DIAGNOSIS — I10 Essential (primary) hypertension: Secondary | ICD-10-CM

## 2025-01-03 LAB — GLUCOSE, POCT (MANUAL RESULT ENTRY): POC Glucose: 89 mg/dL (ref 70–99)

## 2025-01-03 MED ORDER — MECLIZINE HCL 12.5 MG PO TABS: 12.5000 mg | ORAL_TABLET | Freq: Three times a day (TID) | ORAL | 0 refills | Status: AC | PRN

## 2025-01-03 NOTE — ED Provider Notes (Signed)
 " [facility]    CSN: [id number] Arrival date & time: 01/03/25  9173      History   Chief Complaint Chief Complaint  Patient presents with   Dizziness    HPI Nathan Bradford. is a 34 y.o. male.   Patient presents today for dizziness that began last night.  Reports history of similar, has history of vertigo, and reports my equilibrium feels off.  Reports symptoms worse with head movement, rolling over in bed, and reports the room feels offkilter at times.  He denies chest pain or shortness of breath associated with the dizziness.  No diaphoresis, pallor, or symptoms related to exertion.  No headache, double vision, blurred vision, ringing in the ears, photophobia.  No recent head injury, recent viral symptoms.  Reports he has been really tired lately and thinks he is working too much.  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  Reports he has not taken meclizine  for his vertigo in the past.  Denies blacking out, syncope, or palpitations.  Patient acknowledges elevated blood pressure today.  Reports he has tried many medication for his blood pressure and nothing works.  He is due for follow up with PCP.  He does not check blood pressure at home.  It is unclear when he took his last blood pressure medicine.  He is prescribed amlodipine , valsartan , and labetalol .       Past Medical History:  Diagnosis Date   Anxiety    Asthma    [employer]ia 09/23/2020   Bipolar 1 disorder (HCC)    Depression    Depression    [employer] 09/23/2020   Diabetes mellitus without complication (HCC)    [employer]ia 09/23/2020   Hypertension    [employer]reesia 09/23/2020   Vertigo     Patient Active Problem List   Diagnosis Date Noted   Syncope and collapse 01/21/2024   Dermoid cyst of scalp 11/16/2022   Chest pain 03/31/2022   Vitamin D  deficiency 02/12/2022   B12 deficiency 02/09/2022   Hematospermia 02/09/2022   Headache 02/09/2022   Hyperlipidemia 12/17/2021   Pre-diabetes 05/29/2021   GERD  (gastroesophageal reflux disease) 05/29/2021   Bipolar disorder (HCC) 04/08/2021   ST segment changes on electrocardiogram 12/30/2018   Accelerated hypertension 12/30/2018   Generalized anxiety disorder 04/06/2015    Past Surgical History:  Procedure Laterality Date   WISDOM TOOTH EXTRACTION         Home Medications    Prior to Admission medications  Medication Sig Start Date End Date Taking? Authorizing Provider  meclizine  (ANTIVERT ) 12.5 MG tablet Take 1 tablet (12.5 mg total) by mouth 3 (three) times daily as needed for dizziness. Do not take with alcohol or while driving or operating heavy machinery.  May cause drowsiness. 01/03/25  Yes Chandra Raisin A, NP  Acetaminophen  500 MG capsule Take 500-1,000 mg by mouth every 6 (six) hours as needed for pain or fever. Patient not taking: Reported on 02/20/2024    [provider]  albuterol  (VENTOLIN  HFA) 108 (90 Base) MCG/ACT inhaler Inhale 2 puffs into the lungs every 6 (six) hours as needed for wheezing or shortness of breath. 11/16/22   Rollene Almarie LABOR, MD  amLODipine  (NORVASC ) 10 MG tablet Take 1 tablet (10 mg total) by mouth daily. 08/25/24   Lenor Hollering, MD  atorvastatin  (LIPITOR) 20 MG tablet Take 1 tablet (20 mg total) by mouth daily. Patient not taking: Reported on 01/21/2024 11/16/22   Rollene Almarie LABOR, MD  Cholecalciferol (VITAMIN D3 PO)  Take 1 capsule by mouth daily.    [provider]  FLUoxetine  (PROZAC ) 20 MG capsule Take 1 capsule (20 mg total) by mouth daily. 02/24/24   Rollene Almarie LABOR, MD  indapamide  (LOZOL ) 2.5 MG tablet Take 1 tablet (2.5 mg total) by mouth daily. 02/23/24   Rollene Almarie LABOR, MD  labetalol  (NORMODYNE ) 200 MG tablet Take 1 tablet (200 mg total) by mouth 3 (three) times daily. 08/25/24   Lenor Hollering, MD  loratadine  (CLARITIN ) 10 MG tablet Take 1 tablet (10 mg total) by mouth daily. Patient taking differently: Take 10 mg by mouth daily as needed for allergies or  rhinitis. 11/16/22   Rollene Almarie LABOR, MD  potassium chloride  SA (KLOR-CON  M) 20 MEQ tablet Take 1 tablet (20 mEq total) by mouth 2 (two) times daily for 7 days. 01/23/24 01/30/24  Jillian Buttery, MD  valsartan  (DIOVAN ) 320 MG tablet Take 1 tablet (320 mg total) by mouth daily. 08/25/24   Lenor Hollering, MD  Vitamin D , Ergocalciferol , (DRISDOL ) 1.25 MG (50000 UNIT) CAPS capsule Take 50,000 Units by mouth once a week. Patient not taking: Reported on 02/20/2024 11/16/22   [provider]    Family History Family History  Problem Relation Age of Onset   Hypertension Mother    Stroke Mother    Hypertension Father    Kidney disease Father     Social History Social History[1]   Allergies   Chlorthalidone  and Lisinopril    Review of Systems Review of Systems Per HPI  Physical Exam Triage Vital Signs ED Triage Vitals  Encounter Vitals Group     BP 01/03/25 0918 (!) 203/141     Girls Systolic BP Percentile --      Girls Diastolic BP Percentile --      Boys Systolic BP Percentile --      Boys Diastolic BP Percentile --      Pulse Rate 01/03/25 0918 87     Resp 01/03/25 0918 18     Temp 01/03/25 0918 98.2 F (36.8 C)     Temp Source 01/03/25 0918 Oral     SpO2 01/03/25 0918 97 %     Weight --      Height --      Head Circumference --      Peak Flow --      Pain Score 01/03/25 0916 0     Pain Loc --      Pain Education --      Exclude from Growth Chart --    Orthostatic VS for the past 24 hrs:  BP- Lying Pulse- Lying BP- Sitting Pulse- Sitting BP- Standing at 0 minutes Pulse- Standing at 0 minutes  01/03/25 0938 (!) 197/124 75 (!) 192/125 81 (!) 197/126 82    Updated Vital Signs BP (!) 203/141 (BP Location: Left Arm)   Pulse 87   Temp 98.2 F (36.8 C) (Oral)   Resp 18   SpO2 97%   Visual Acuity Right Eye Distance:   Left Eye Distance:   Bilateral Distance:    Right Eye Near:   Left Eye Near:    Bilateral Near:     Physical Exam Vitals and nursing  note reviewed.  Constitutional:      General: He is not in acute distress.    Appearance: Normal appearance. He is obese. He is not ill-appearing, toxic-appearing or diaphoretic.  HENT:     Head: Normocephalic and atraumatic.     Right Ear: Tympanic membrane, ear canal and  external ear normal. There is no impacted cerumen.     Left Ear: Tympanic membrane, ear canal and external ear normal. There is no impacted cerumen.     Nose: Nose normal. No congestion or rhinorrhea.     Mouth/Throat:     Mouth: Mucous membranes are moist.     Pharynx: Oropharynx is clear. No posterior oropharyngeal erythema.  Eyes:     General: No scleral icterus.    Extraocular Movements: Extraocular movements intact.     Pupils: Pupils are equal, round, and reactive to light.  Cardiovascular:     Rate and Rhythm: Normal rate and regular rhythm.  Pulmonary:     Effort: Pulmonary effort is normal. No respiratory distress.     Breath sounds: Normal breath sounds. No wheezing, rhonchi or rales.  Abdominal:     General: Abdomen is flat. Bowel sounds are normal. There is no distension.     Palpations: Abdomen is soft.     Tenderness: There is no abdominal tenderness. There is no right CVA tenderness, left CVA tenderness or guarding.  Musculoskeletal:        General: Normal range of motion.     Cervical back: Normal range of motion and neck supple. No rigidity or tenderness.     Right lower leg: No edema.     Left lower leg: No edema.  Lymphadenopathy:     Cervical: No cervical adenopathy.  Skin:    General: Skin is warm and dry.     Capillary Refill: Capillary refill takes less than 2 seconds.     Coloration: Skin is not jaundiced or pale.     Findings: No erythema.  Neurological:     Mental Status: He is alert and oriented to person, place, and time.     Cranial Nerves: Cranial nerves 2-12 are intact.     Sensory: Sensation is intact.     Motor: No weakness.     Coordination: Coordination is intact.  Coordination normal. Heel to [facility] Test normal. Rapid alternating movements normal.     Gait: Gait is intact. Gait normal.  Psychiatric:        Behavior: Behavior is cooperative.      UC Treatments / Results  Labs (all labs ordered are listed, but only abnormal results are displayed) Labs Reviewed  GLUCOSE, POCT (MANUAL RESULT ENTRY)    EKG   Radiology No results found.  Procedures Procedures (including critical care time)  Medications Ordered in UC Medications - No data to display  Initial Impression / Assessment and Plan / UC Course  I have reviewed the triage vital signs and the nursing notes.  Pertinent labs & imaging results that were available during my care of the patient were reviewed by me and considered in my medical decision making (see chart for details).   Patient is a well-appearing 34 year old male presenting today for dizziness and uncontrolled hypertension.  Blood pressure is 203/141 in urgent care today; upon recheck and with orthostatic vital signs, blood pressure is mildly improved.  Patient admits to not taking blood pressure medication as prescribed and is overdue for follow-up with primary care provider.  Dizziness today does not seem to be caused by elevated blood pressure as it sounds like it is vertigo.  Random blood sugar is not low; orthostatic vital signs are negative and EKG shows ventricular rate of 78 bpm without new ST segment or T wave changes.  Very comparable to most recent EKG.  He is asymptomatic of elevated blood  pressure otherwise.  I recommended resuming blood pressure medication and close follow-up with primary care provider ideally next week.  In meantime, start meclizine  as needed for vertigo.  Note for work given.  The patient was given the opportunity to ask questions.  All questions answered to their satisfaction.  The patient is in agreement to this plan.   Final Clinical Impressions(s) / UC Diagnoses   Final diagnoses:  Dizziness   Uncontrolled hypertension  Vertigo     Discharge Instructions      The EKG today is stable.  Blood sugar is also stable.  Your symptoms are consistent with vertigo; you can take the meclizine  as needed to help with the dizziness.  Also recommend resuming your blood pressure medication and taking as prescribed.  Please follow up with your PCP next week for reevaluation especially if dizziness symptoms persist/worsen.       ED Prescriptions     Medication Sig Dispense Auth. Provider   meclizine  (ANTIVERT ) 12.5 MG tablet Take 1 tablet (12.5 mg total) by mouth 3 (three) times daily as needed for dizziness. Do not take with alcohol or while driving or operating heavy machinery.  May cause drowsiness. 10 tablet Chandra Harlene LABOR, NP      PDMP not reviewed this encounter.      [1]  Social History Tobacco Use   Smoking status: Never   Smokeless tobacco: Never  Vaping Use   Vaping status: Never Used  Substance Use Topics   Alcohol use: No   Drug use: No     Chandra Harlene LABOR, NP 01/03/25 1352  "

## 2025-01-03 NOTE — Discharge Instructions (Signed)
 The EKG today is stable.  Blood sugar is also stable.  Your symptoms are consistent with vertigo; you can take the meclizine  as needed to help with the dizziness.  Also recommend resuming your blood pressure medication and taking as prescribed.  Please follow up with your PCP next week for reevaluation especially if dizziness symptoms persist/worsen.

## 2025-01-03 NOTE — ED Triage Notes (Signed)
 Pt reports starting last night when moves around gets dizzy. Reports that happened again this morning and had to call out of work. 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
